# Patient Record
Sex: Male | Born: 1970 | Race: Black or African American | Hispanic: No | Smoking: Never smoker
Health system: Southern US, Community
[De-identification: ages and names within clinical notes are randomized; demographics above are authoritative.]

## PROBLEM LIST (undated history)

## (undated) DIAGNOSIS — R200 Anesthesia of skin: Secondary | ICD-10-CM

## (undated) DIAGNOSIS — K37 Unspecified appendicitis: Secondary | ICD-10-CM

## (undated) DIAGNOSIS — R208 Other disturbances of skin sensation: Secondary | ICD-10-CM

## (undated) DIAGNOSIS — K651 Peritoneal abscess: Secondary | ICD-10-CM

## (undated) HISTORY — PX: APPENDECTOMY: SHX54

---

## 2000-10-21 ENCOUNTER — Encounter: Payer: Self-pay | Admitting: Emergency Medicine

## 2000-10-21 ENCOUNTER — Emergency Department (HOSPITAL_COMMUNITY): Admission: EM | Admit: 2000-10-21 | Discharge: 2000-10-21 | Payer: Self-pay | Admitting: Emergency Medicine

## 2002-11-17 ENCOUNTER — Emergency Department (HOSPITAL_COMMUNITY): Admission: EM | Admit: 2002-11-17 | Discharge: 2002-11-17 | Payer: Self-pay | Admitting: *Deleted

## 2003-03-08 ENCOUNTER — Encounter: Payer: Self-pay | Admitting: Emergency Medicine

## 2003-03-08 ENCOUNTER — Emergency Department (HOSPITAL_COMMUNITY): Admission: EM | Admit: 2003-03-08 | Discharge: 2003-03-09 | Payer: Self-pay | Admitting: Emergency Medicine

## 2003-03-09 ENCOUNTER — Emergency Department (HOSPITAL_COMMUNITY): Admission: AC | Admit: 2003-03-09 | Discharge: 2003-03-09 | Payer: Self-pay

## 2003-03-09 ENCOUNTER — Emergency Department (HOSPITAL_COMMUNITY): Admission: EM | Admit: 2003-03-09 | Discharge: 2003-03-09 | Payer: Self-pay | Admitting: Emergency Medicine

## 2003-04-28 ENCOUNTER — Encounter: Payer: Self-pay | Admitting: Family Medicine

## 2003-04-28 ENCOUNTER — Emergency Department (HOSPITAL_COMMUNITY): Admission: AD | Admit: 2003-04-28 | Discharge: 2003-04-28 | Payer: Self-pay | Admitting: Family Medicine

## 2003-05-08 ENCOUNTER — Encounter: Payer: Self-pay | Admitting: Orthopaedic Surgery

## 2003-05-08 ENCOUNTER — Ambulatory Visit (HOSPITAL_COMMUNITY): Admission: RE | Admit: 2003-05-08 | Discharge: 2003-05-08 | Payer: Self-pay | Admitting: Orthopaedic Surgery

## 2003-08-24 ENCOUNTER — Emergency Department (HOSPITAL_COMMUNITY): Admission: EM | Admit: 2003-08-24 | Discharge: 2003-08-24 | Payer: Self-pay

## 2003-10-19 ENCOUNTER — Emergency Department (HOSPITAL_COMMUNITY): Admission: EM | Admit: 2003-10-19 | Discharge: 2003-10-19 | Payer: Self-pay

## 2004-04-06 ENCOUNTER — Emergency Department (HOSPITAL_COMMUNITY): Admission: EM | Admit: 2004-04-06 | Discharge: 2004-04-06 | Payer: Self-pay | Admitting: Emergency Medicine

## 2004-04-22 ENCOUNTER — Emergency Department (HOSPITAL_COMMUNITY): Admission: EM | Admit: 2004-04-22 | Discharge: 2004-04-22 | Payer: Self-pay | Admitting: Emergency Medicine

## 2012-08-04 ENCOUNTER — Emergency Department (HOSPITAL_COMMUNITY): Payer: Self-pay

## 2012-08-04 ENCOUNTER — Emergency Department (HOSPITAL_COMMUNITY)
Admission: EM | Admit: 2012-08-04 | Discharge: 2012-08-04 | Disposition: A | Discharge status: Home / Self Care | Payer: Self-pay | Attending: Emergency Medicine | Admitting: Emergency Medicine

## 2012-08-04 ENCOUNTER — Encounter (HOSPITAL_COMMUNITY): Payer: Self-pay

## 2012-08-04 DIAGNOSIS — Y929 Unspecified place or not applicable: Secondary | ICD-10-CM | POA: Insufficient documentation

## 2012-08-04 DIAGNOSIS — R0789 Other chest pain: Secondary | ICD-10-CM

## 2012-08-04 DIAGNOSIS — Y9389 Activity, other specified: Secondary | ICD-10-CM | POA: Insufficient documentation

## 2012-08-04 DIAGNOSIS — M549 Dorsalgia, unspecified: Secondary | ICD-10-CM | POA: Insufficient documentation

## 2012-08-04 DIAGNOSIS — X503XXA Overexertion from repetitive movements, initial encounter: Secondary | ICD-10-CM | POA: Insufficient documentation

## 2012-08-04 DIAGNOSIS — S298XXA Other specified injuries of thorax, initial encounter: Secondary | ICD-10-CM | POA: Insufficient documentation

## 2012-08-04 MED ORDER — IBUPROFEN 600 MG PO TABS: 600.0000 mg | ORAL_TABLET | Freq: Four times a day (QID) | ORAL | Status: AC | PRN

## 2012-08-04 MED ORDER — CYCLOBENZAPRINE HCL 5 MG PO TABS: 5.0000 mg | ORAL_TABLET | Freq: Three times a day (TID) | ORAL | Status: DC | PRN
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
## 2012-08-04 NOTE — ED Notes (Signed)
Pt presents with c/o lower rt rib pain that began last night. Pt denies SOB and increased pain with inhalation. Pt reports lifting weights last night. NAD noted
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
## 2012-08-06 NOTE — ED Provider Notes (Signed)
Medical screening examination/treatment/procedure(s) were performed by non-physician practitioner and as supervising physician I was immediately available for consultation/collaboration.  Donnetta Hutching, MD 08/06/12 2207

## 2015-10-09 ENCOUNTER — Encounter (HOSPITAL_COMMUNITY): Payer: Self-pay

## 2015-10-09 ENCOUNTER — Emergency Department (HOSPITAL_COMMUNITY)
Admission: EM | Admit: 2015-10-09 | Discharge: 2015-10-10 | Disposition: A | Discharge status: Home / Self Care | Payer: Self-pay | Attending: Emergency Medicine | Admitting: Emergency Medicine

## 2015-10-09 DIAGNOSIS — R1013 Epigastric pain: Secondary | ICD-10-CM | POA: Insufficient documentation

## 2015-10-09 DIAGNOSIS — R112 Nausea with vomiting, unspecified: Secondary | ICD-10-CM | POA: Insufficient documentation

## 2015-10-09 LAB — COMPREHENSIVE METABOLIC PANEL
ALBUMIN: 4.4 g/dL (ref 3.5–5.0)
ALK PHOS: 58 U/L (ref 38–126)
ALT: 15 U/L — ABNORMAL LOW (ref 17–63)
ANION GAP: 9 (ref 5–15)
AST: 24 U/L (ref 15–41)
BILIRUBIN TOTAL: 1.1 mg/dL (ref 0.3–1.2)
BUN: 10 mg/dL (ref 6–20)
CALCIUM: 8.9 mg/dL (ref 8.9–10.3)
CO2: 26 mmol/L (ref 22–32)
Chloride: 100 mmol/L — ABNORMAL LOW (ref 101–111)
Creatinine, Ser: 1.21 mg/dL (ref 0.61–1.24)
GFR calc Af Amer: >60 mL/min (ref >60)
GFR calc non Af Amer: >60 mL/min (ref >60)
GLUCOSE: 146 mg/dL — AB (ref 65–99)
Potassium: 5.1 mmol/L (ref 3.5–5.1)
SODIUM: 135 mmol/L (ref 135–145)
TOTAL PROTEIN: 7.5 g/dL (ref 6.5–8.1)

## 2015-10-09 LAB — CBC WITH DIFFERENTIAL/PLATELET
BASOS ABS: 0 10*3/uL (ref 0.0–0.1)
BASOS PCT: 0 %
EOS ABS: 0 10*3/uL (ref 0.0–0.7)
Eosinophils Relative: 0 %
HEMATOCRIT: 43.1 % (ref 39.0–52.0)
HEMOGLOBIN: 14.3 g/dL (ref 13.0–17.0)
Lymphocytes Relative: 12 %
Lymphs Abs: 0.9 10*3/uL (ref 0.7–4.0)
MCH: 31 pg (ref 26.0–34.0)
MCHC: 33.2 g/dL (ref 30.0–36.0)
MCV: 93.3 fL (ref 78.0–100.0)
MONOS PCT: 2 %
Monocytes Absolute: 0.2 10*3/uL (ref 0.1–1.0)
NEUTROS ABS: 6.2 10*3/uL (ref 1.7–7.7)
NEUTROS PCT: 86 %
Platelets: 239 10*3/uL (ref 150–400)
RBC: 4.62 MIL/uL (ref 4.22–5.81)
RDW: 12.8 % (ref 11.5–15.5)
WBC: 7.3 10*3/uL (ref 4.0–10.5)

## 2015-10-09 LAB — LIPASE, BLOOD: Lipase: 37 U/L (ref 11–51)

## 2015-10-09 MED ORDER — SODIUM CHLORIDE 0.9 % IV BOLUS (SEPSIS)
1000.0000 mL | Freq: Once | INTRAVENOUS | Status: AC
Start: 2015-10-09 — End: 2015-10-09
  Administered 2015-10-09: 1000 mL via INTRAVENOUS

## 2015-10-09 MED ORDER — ONDANSETRON HCL 4 MG/2ML IJ SOLN
4.0000 mg | Freq: Once | INTRAMUSCULAR | Status: AC
  Administered 2015-10-09: 4 mg via INTRAVENOUS
  Filled 2015-10-09: qty 2

## 2015-10-09 MED ORDER — ONDANSETRON 4 MG PO TBDP: ORAL_TABLET | ORAL | Status: DC

## 2015-10-09 MED ORDER — KETOROLAC TROMETHAMINE 30 MG/ML IJ SOLN
15.0000 mg | Freq: Once | INTRAMUSCULAR | Status: AC
  Administered 2015-10-09: 15 mg via INTRAVENOUS
  Filled 2015-10-09: qty 1

## 2015-10-09 NOTE — Discharge Instructions (Signed)
Return for fevers, persistent vomiting, right upper quadrant pain, right lower quadrant pain or other concerns. Take Zofran as needed for nausea.  If you were given medicines take as directed.  If you are on coumadin or contraceptives realize their levels and effectiveness is altered by many different medicines.  If you have any reaction (rash, tongues swelling, other) to the medicines stop taking and see a physician.    If your blood pressure was elevated in the ER make sure you follow up for management with a primary doctor or return for chest pain, shortness of breath or stroke symptoms.  Please follow up as directed and return to the ER or see a physician for new or worsening symptoms.  Thank you. Filed Vitals:   10/09/15 1930 10/09/15 1933 10/09/15 2211  BP: 86/44 99/40 137/83  Pulse: 60  75  Temp: 97.4 F (36.3 C)  98.3 F (36.8 C)  TempSrc: Oral  Oral  Resp: 18  18  Height: 6\' 3"  (1.905 m)    Weight: 257 lb (116.574 kg)    SpO2: 100%  97%

## 2015-10-09 NOTE — ED Provider Notes (Signed)
CSN: [id number]     Arrival date & time 10/09/15  1919 History   First MD Initiated Contact with Patient 10/09/15 1954     Chief Complaint  Patient presents with  . Abdominal Pain     (Consider location/radiation/quality/duration/timing/severity/associated sxs/prior Treatment) HPI Comments: 45 year old male with no significant medical history, nonsmoker presents with intermittent epigastric pain and vomiting since earlier today. No significant sick contacts no recent travel. No known gallbladder problems per no blood in the stools. No diarrhea. Patient gradually feeling better.  Patient is a 45 y.o. male presenting with abdominal pain. The history is provided by the patient.  Abdominal Pain Associated symptoms: nausea and vomiting   Associated symptoms: no chest pain, no chills, no dysuria, no fever and no shortness of breath     History reviewed. No pertinent past medical history. History reviewed. No pertinent past surgical history. No family history on file. Social History  Substance Use Topics  . Smoking status: Never Smoker   . Smokeless tobacco: None  . Alcohol Use: Yes     Comment: occ    Review of Systems  Constitutional: Negative for fever and chills.  HENT: Negative for congestion.   Eyes: Negative for visual disturbance.  Respiratory: Negative for shortness of breath.   Cardiovascular: Negative for chest pain.  Gastrointestinal: Positive for nausea, vomiting and abdominal pain. Negative for blood in stool.  Genitourinary: Negative for dysuria and flank pain.  Musculoskeletal: Negative for back pain, neck pain and neck stiffness.  Skin: Negative for rash.  Neurological: Negative for light-headedness and headaches.      Allergies  Review of patient's allergies indicates no known allergies.  Home Medications   Prior to Admission medications   Medication Sig Start Date End Date Taking? Authorizing Provider  OVER THE COUNTER MEDICATION Take 1 capsule by mouth  2 (two) times daily. Hydroxycut   Yes Historical Provider, MD  ondansetron (ZOFRAN ODT) 4 MG disintegrating tablet 4mg  ODT q4 hours prn nausea/vomit 10/09/15   Blane OharaJoshua Dijon Kohlman, MD   BP 137/83 mmHg  Pulse 75  Temp(Src) 98.3 F (36.8 C) (Oral)  Resp 18  Ht 6\' 3"  (1.905 m)  Wt 257 lb (116.574 kg)  BMI 32.12 kg/m2  SpO2 97% Physical Exam  Constitutional: He is oriented to person, place, and time. He appears well-developed and well-nourished.  HENT:  Head: Normocephalic and atraumatic.  Eyes: Conjunctivae are normal. Right eye exhibits no discharge. Left eye exhibits no discharge.  Neck: Normal range of motion. Neck supple. No tracheal deviation present.  Cardiovascular: Normal rate and regular rhythm.   Pulmonary/Chest: Effort normal and breath sounds normal.  Abdominal: Soft. He exhibits no distension. There is tenderness (mild epigastric). There is no guarding.  Musculoskeletal: He exhibits no edema.  Neurological: He is alert and oriented to person, place, and time.  Skin: Skin is warm. No rash noted.  Psychiatric: He has a normal mood and affect.  Nursing note and vitals reviewed.   ED Course  Procedures (including critical care time) Labs Review Labs Reviewed  COMPREHENSIVE METABOLIC PANEL - Abnormal; Notable for the following:    Chloride 100 (*)    Glucose, Bld 146 (*)    ALT 15 (*)    All other components within normal limits  CBC WITH DIFFERENTIAL/PLATELET  LIPASE, BLOOD    Imaging Review No results found. I have personally reviewed and evaluated these images and lab results as part of my medical decision-making.   EKG Interpretation None  MDM   Final diagnoses:  Epigastric pain  Non-intractable vomiting with nausea, vomiting of unspecified type   Well-appearing and I'll presents with mild abdominal pain intermittent vomiting. Discussed likely viral/toxin mediated however if he gets right upper quadrant pain fevers or does not improve in 24-48 hours to  see a physician. Patient improved on reassessment.  Results and differential diagnosis were discussed with the patient/parent/guardian. Xrays were independently reviewed by myself.  Close follow up outpatient was discussed, comfortable with the plan.   Medications  sodium chloride 0.9 % bolus 1,000 mL (0 mLs Intravenous Stopped 10/09/15 2120)  ketorolac (TORADOL) 30 MG/ML injection 15 mg (15 mg Intravenous Given 10/09/15 2030)  ondansetron (ZOFRAN) injection 4 mg (4 mg Intravenous Given 10/09/15 2027)    Filed Vitals:   10/09/15 1930 10/09/15 1933 10/09/15 2211  BP: 86/44 99/40 137/83  Pulse: 60  75  Temp: 97.4 F (36.3 C)  98.3 F (36.8 C)  TempSrc: Oral  Oral  Resp: 18  18  Height:  (1.905 m)    Weight: 257 lb (116.574 kg)    SpO2: 100%  97%    Final diagnoses:  Epigastric pain  Non-intractable vomiting with nausea, vomiting of unspecified type        Blane Ohara, MD 10/09/15 2306

## 2015-10-09 NOTE — ED Notes (Signed)
Abdominal pain all day. I have vomited a couple of times this morning but have not vomited any after noon today. Denies diarrhea.

## 2015-10-10 ENCOUNTER — Inpatient Hospital Stay (HOSPITAL_COMMUNITY)
Admission: EM | Admit: 2015-10-10 | Discharge: 2015-10-19 | DRG: 372 | Disposition: A | Discharge status: Home Health | Payer: Self-pay | Attending: General Surgery | Admitting: General Surgery

## 2015-10-10 ENCOUNTER — Emergency Department (HOSPITAL_COMMUNITY): Payer: Self-pay

## 2015-10-10 ENCOUNTER — Encounter (HOSPITAL_COMMUNITY): Payer: Self-pay | Admitting: Emergency Medicine

## 2015-10-10 DIAGNOSIS — E860 Dehydration: Secondary | ICD-10-CM | POA: Diagnosis present

## 2015-10-10 DIAGNOSIS — E872 Acidosis: Secondary | ICD-10-CM | POA: Diagnosis present

## 2015-10-10 DIAGNOSIS — K353 Acute appendicitis with localized peritonitis: Principal | ICD-10-CM | POA: Diagnosis present

## 2015-10-10 DIAGNOSIS — K3532 Acute appendicitis with perforation and localized peritonitis, without abscess: Secondary | ICD-10-CM | POA: Diagnosis present

## 2015-10-10 DIAGNOSIS — I10 Essential (primary) hypertension: Secondary | ICD-10-CM | POA: Diagnosis present

## 2015-10-10 DIAGNOSIS — K567 Ileus, unspecified: Secondary | ICD-10-CM | POA: Diagnosis present

## 2015-10-10 LAB — COMPREHENSIVE METABOLIC PANEL
ALK PHOS: 50 U/L (ref 38–126)
ALT: 12 U/L — AB (ref 17–63)
AST: 17 U/L (ref 15–41)
Albumin: 4 g/dL (ref 3.5–5.0)
Anion gap: 11 (ref 5–15)
BUN: 15 mg/dL (ref 6–20)
CALCIUM: 8.6 mg/dL — AB (ref 8.9–10.3)
CO2: 28 mmol/L (ref 22–32)
CREATININE: 1.51 mg/dL — AB (ref 0.61–1.24)
Chloride: 99 mmol/L — ABNORMAL LOW (ref 101–111)
GFR, EST NON AFRICAN AMERICAN: 54 mL/min — AB (ref >60)
Glucose, Bld: 201 mg/dL — ABNORMAL HIGH (ref 65–99)
Potassium: 4 mmol/L (ref 3.5–5.1)
Sodium: 138 mmol/L (ref 135–145)
Total Bilirubin: 1.1 mg/dL (ref 0.3–1.2)
Total Protein: 6.9 g/dL (ref 6.5–8.1)

## 2015-10-10 LAB — CBC WITH DIFFERENTIAL/PLATELET
BASOS PCT: 0 %
Basophils Absolute: 0 10*3/uL (ref 0.0–0.1)
EOS ABS: 0 10*3/uL (ref 0.0–0.7)
Eosinophils Relative: 0 %
HCT: 42.3 % (ref 39.0–52.0)
HEMOGLOBIN: 14.4 g/dL (ref 13.0–17.0)
Lymphocytes Relative: 6 %
Lymphs Abs: 0.6 10*3/uL — ABNORMAL LOW (ref 0.7–4.0)
MCH: 31.6 pg (ref 26.0–34.0)
MCHC: 34 g/dL (ref 30.0–36.0)
MCV: 93 fL (ref 78.0–100.0)
MONOS PCT: 6 %
Monocytes Absolute: 0.6 10*3/uL (ref 0.1–1.0)
NEUTROS PCT: 88 %
Neutro Abs: 8.6 10*3/uL — ABNORMAL HIGH (ref 1.7–7.7)
PLATELETS: 261 10*3/uL (ref 150–400)
RBC: 4.55 MIL/uL (ref 4.22–5.81)
RDW: 12.9 % (ref 11.5–15.5)
WBC: 9.7 10*3/uL (ref 4.0–10.5)

## 2015-10-10 LAB — URINALYSIS, ROUTINE W REFLEX MICROSCOPIC
Glucose, UA: NEGATIVE mg/dL
HGB URINE DIPSTICK: NEGATIVE
KETONES UR: 15 mg/dL — AB
Leukocytes, UA: NEGATIVE
Nitrite: NEGATIVE
PROTEIN: 30 mg/dL — AB
Specific Gravity, Urine: 1.01 (ref 1.005–1.030)
pH: 6 (ref 5.0–8.0)

## 2015-10-10 LAB — URINE MICROSCOPIC-ADD ON
Bacteria, UA: NONE SEEN
SQUAMOUS EPITHELIAL / LPF: NONE SEEN
WBC, UA: NONE SEEN WBC/hpf (ref 0–5)

## 2015-10-10 LAB — I-STAT CG4 LACTIC ACID, ED: Lactic Acid, Venous: 2.28 mmol/L (ref 0.5–2.0)

## 2015-10-10 LAB — LIPASE, BLOOD: LIPASE: 25 U/L (ref 11–51)

## 2015-10-10 MED ORDER — OXYCODONE-ACETAMINOPHEN 5-325 MG PO TABS
1.0000 | ORAL_TABLET | ORAL | Status: DC | PRN
  Administered 2015-10-11 – 2015-10-16 (×8): 2 via ORAL
  Filled 2015-10-10 (×9): qty 2

## 2015-10-10 MED ORDER — PIPERACILLIN-TAZOBACTAM 3.375 G IVPB
3.3750 g | Freq: Three times a day (TID) | INTRAVENOUS | Status: DC
  Administered 2015-10-11 – 2015-10-17 (×19): 3.375 g via INTRAVENOUS
  Filled 2015-10-10 (×22): qty 50

## 2015-10-10 MED ORDER — SODIUM CHLORIDE 0.9 % IV BOLUS (SEPSIS)
1000.0000 mL | Freq: Once | INTRAVENOUS | Status: AC
  Administered 2015-10-10: 1000 mL via INTRAVENOUS

## 2015-10-10 MED ORDER — HYDROMORPHONE HCL 1 MG/ML IJ SOLN
1.0000 mg | INTRAMUSCULAR | Status: DC | PRN
  Administered 2015-10-10 – 2015-10-19 (×45): 1 mg via INTRAVENOUS
  Filled 2015-10-10 (×47): qty 1

## 2015-10-10 MED ORDER — ONDANSETRON HCL 4 MG/2ML IJ SOLN
4.0000 mg | Freq: Once | INTRAMUSCULAR | Status: AC
  Administered 2015-10-10: 4 mg via INTRAVENOUS
  Filled 2015-10-10: qty 2

## 2015-10-10 MED ORDER — IOHEXOL 300 MG/ML  SOLN
50.0000 mL | Freq: Once | INTRAMUSCULAR | Status: AC | PRN
  Administered 2015-10-10: 50 mL via ORAL

## 2015-10-10 MED ORDER — DIPHENHYDRAMINE HCL 50 MG/ML IJ SOLN: 25.0000 mg | Freq: Four times a day (QID) | INTRAMUSCULAR | Status: DC | PRN

## 2015-10-10 MED ORDER — HYDROMORPHONE HCL 1 MG/ML IJ SOLN
1.0000 mg | Freq: Once | INTRAMUSCULAR | Status: AC
  Administered 2015-10-10: 1 mg via INTRAVENOUS
  Filled 2015-10-10: qty 1

## 2015-10-10 MED ORDER — ACETAMINOPHEN 325 MG PO TABS
650.0000 mg | ORAL_TABLET | Freq: Four times a day (QID) | ORAL | Status: DC | PRN
  Administered 2015-10-15: 650 mg via ORAL
  Administered 2015-10-15: 325 mg via ORAL
  Administered 2015-10-15 – 2015-10-16 (×2): 650 mg via ORAL
  Filled 2015-10-10 (×5): qty 2

## 2015-10-10 MED ORDER — ACETAMINOPHEN 650 MG RE SUPP
650.0000 mg | Freq: Four times a day (QID) | RECTAL | Status: DC | PRN
  Administered 2015-10-14: 650 mg via RECTAL

## 2015-10-10 MED ORDER — ONDANSETRON 4 MG PO TBDP: 4.0000 mg | ORAL_TABLET | Freq: Four times a day (QID) | ORAL | Status: DC | PRN

## 2015-10-10 MED ORDER — ONDANSETRON HCL 4 MG/2ML IJ SOLN: 4.0000 mg | Freq: Four times a day (QID) | INTRAMUSCULAR | Status: DC | PRN

## 2015-10-10 MED ORDER — SENNOSIDES-DOCUSATE SODIUM 8.6-50 MG PO TABS: 1.0000 | ORAL_TABLET | Freq: Every evening | ORAL | Status: DC | PRN

## 2015-10-10 MED ORDER — SIMETHICONE 80 MG PO CHEW
40.0000 mg | CHEWABLE_TABLET | Freq: Four times a day (QID) | ORAL | Status: DC | PRN
  Administered 2015-10-11: 40 mg via ORAL
  Filled 2015-10-10: qty 1

## 2015-10-10 MED ORDER — LACTATED RINGERS IV SOLN
INTRAVENOUS | Status: DC
  Administered 2015-10-10: 1000 mL via INTRAVENOUS
  Administered 2015-10-11 – 2015-10-12 (×2): via INTRAVENOUS
  Administered 2015-10-18: 10 mL/h via INTRAVENOUS

## 2015-10-10 MED ORDER — IOHEXOL 300 MG/ML  SOLN
100.0000 mL | Freq: Once | INTRAMUSCULAR | Status: AC | PRN
  Administered 2015-10-10: 100 mL via INTRAVENOUS

## 2015-10-10 MED ORDER — DIPHENHYDRAMINE HCL 25 MG PO CAPS: 25.0000 mg | ORAL_CAPSULE | Freq: Four times a day (QID) | ORAL | Status: DC | PRN

## 2015-10-10 MED ORDER — PIPERACILLIN-TAZOBACTAM 3.375 G IVPB 30 MIN
3.3750 g | Freq: Once | INTRAVENOUS | Status: AC
  Administered 2015-10-10: 3.375 g via INTRAVENOUS
  Filled 2015-10-10: qty 50

## 2015-10-10 MED ORDER — ENOXAPARIN SODIUM 40 MG/0.4ML ~~LOC~~ SOLN
40.0000 mg | SUBCUTANEOUS | Status: DC
  Administered 2015-10-10 – 2015-10-12 (×3): 40 mg via SUBCUTANEOUS
  Filled 2015-10-10 (×3): qty 0.4

## 2015-10-10 NOTE — ED Notes (Signed)
PT c/o continued nausea today with upper abdominal pain and took Zofran at 1100 today. PT states was seen in ED last night and results were negative and was d/c to home.

## 2015-10-10 NOTE — ED Provider Notes (Signed)
CSN: [id number]     Arrival date & time 10/10/15  1350 History   First MD Initiated Contact with Patient 10/10/15 1418     Chief Complaint  Patient presents with  . Abdominal Pain     (Consider location/radiation/quality/duration/timing/severity/associated sxs/prior Treatment) HPI Comments: The patient is a 45 year old male, he does have a prior history of alcohol abuse, he denies smoking, he reports that last night he developed abdominal pain, it was epigastric in location, he had been vomiting throughout the day, he never has had any abdominal surgery and denied any blood in his stools or diarrhea. The patient was seen in the emergency department, he had labs which were fairly unremarkable, his pain improved back to baseline and he was discharged home. He states that through the course of the last 12 hours she has had some ongoing pain and discomfort, and seems to have gotten worse, it is diffuse, he cannot pinpoint the location and there has been associated nausea though he has only vomited once in the last 12 hours. He denies any diarrhea and states that in fact he cannot even pass gas.  Patient is a 45 y.o. male presenting with abdominal pain. The history is provided by the patient and medical records.  Abdominal Pain   History reviewed. No pertinent past medical history. History reviewed. No pertinent past surgical history. History reviewed. No pertinent family history. Social History  Substance Use Topics  . Smoking status: Never Smoker   . Smokeless tobacco: None  . Alcohol Use: Yes     Comment: occ    Review of Systems  Gastrointestinal: Positive for abdominal pain.  All other systems reviewed and are negative.     Allergies  Review of patient's allergies indicates no known allergies.  Home Medications   Prior to Admission medications   Medication Sig Start Date End Date Taking? Authorizing Provider  acetaminophen (TYLENOL) 500 MG tablet Take 1,000 mg by mouth every  6 (six) hours as needed for moderate pain.   Yes Historical Provider, MD  ondansetron (ZOFRAN ODT) 4 MG disintegrating tablet 4mg  ODT q4 hours prn nausea/vomit Patient taking differently: Take 4 mg by mouth every 4 (four) hours as needed for nausea or vomiting. 4mg  ODT q4 hours prn nausea/vomit 10/09/15  Yes Blane OharaJoshua Zavitz, MD  OVER THE COUNTER MEDICATION Take 2 capsules by mouth daily. Hydroxycut   Yes Historical Provider, MD   BP 110/78 mmHg  Pulse 62  Temp(Src) 97.5 F (36.4 C) (Oral)  Resp 17  Ht 6' 2.5" (1.892 m)  Wt 258 lb (117.028 kg)  BMI 32.69 kg/m2  SpO2 98% Physical Exam  Constitutional: He appears well-developed and well-nourished. No distress.  HENT:  Head: Normocephalic and atraumatic.  Mouth/Throat: Oropharynx is clear and moist. No oropharyngeal exudate.  Eyes: Conjunctivae and EOM are normal. Pupils are equal, round, and reactive to light. Right eye exhibits no discharge. Left eye exhibits no discharge. No scleral icterus.  Neck: Normal range of motion. Neck supple. No JVD present. No thyromegaly present.  Cardiovascular: Normal rate, regular rhythm, normal heart sounds and intact distal pulses.  Exam reveals no gallop and no friction rub.   No murmur heard. Pulmonary/Chest: Effort normal and breath sounds normal. No respiratory distress. He has no wheezes. He has no rales.  Abdominal: Soft. Bowel sounds are normal. He exhibits no distension and no mass. There is tenderness. There is no rebound and no guarding.  Diffuse abdominal tenderness to palpation, no guarding or rebound, there is no  tympanitic sounds to percussion  Musculoskeletal: Normal range of motion. He exhibits no edema or tenderness.  Lymphadenopathy:    He has no cervical adenopathy.  Neurological: He is alert. Coordination normal.  Skin: Skin is warm and dry. No rash noted. No erythema.  Psychiatric: He has a normal mood and affect. His behavior is normal.  Nursing note and vitals reviewed.   ED  Course  Procedures (including critical care time) Labs Review Labs Reviewed  COMPREHENSIVE METABOLIC PANEL - Abnormal; Notable for the following:    Chloride 99 (*)    Glucose, Bld 201 (*)    Creatinine, Ser 1.51 (*)    Calcium 8.6 (*)    ALT 12 (*)    GFR calc non Af Amer 54 (*)    All other components within normal limits  CBC WITH DIFFERENTIAL/PLATELET - Abnormal; Notable for the following:    Neutro Abs 8.6 (*)    Lymphs Abs 0.6 (*)    All other components within normal limits  I-STAT CG4 LACTIC ACID, ED - Abnormal; Notable for the following:    Lactic Acid, Venous 2.28 (*)    All other components within normal limits  LIPASE, BLOOD    Imaging Review Dg Chest Port 1 View  10/10/2015  CLINICAL DATA:  Nausea with upper abdominal region pain EXAM: PORTABLE CHEST 1 VIEW COMPARISON:  June 04, 2013 FINDINGS: Degree of inspiration shallow. There is mild vessel crowding in the right base. No edema or consolidation. Heart is upper normal in size with pulmonary vascularity within normal limits. No adenopathy. There is an old healed fracture of the anterior right second rib. IMPRESSION: No edema or consolidation.  Shallow degree of inspiration. Electronically Signed   By: Bretta Bang III M.D.   On: 10/10/2015 15:28   I have personally reviewed and evaluated these images and lab results as part of my medical decision-making.    MDM   Final diagnoses:  None    The patient appears uncomfortable, he has diffuse tenderness, he is not tachycardic but his initial blood pressure was hypotensive. He will be given IV fluids, labs, will obtain CT scan to evaluate for the source of the patient's pain as well as an upright chest x-ray to look for free air though I think this is less likely as he is not peritoneal.  Change of shift - care signed out to Dr. Estell Harpin to recheck after CT completed and determine disposition.  Meds given in ED:  Medications  sodium chloride 0.9 % bolus 1,000  mL (1,000 mLs Intravenous New Bag/Given 10/10/15 1443)  sodium chloride 0.9 % bolus 1,000 mL (1,000 mLs Intravenous New Bag/Given 10/10/15 1443)  ondansetron (ZOFRAN) injection 4 mg (4 mg Intravenous Given 10/10/15 1443)  HYDROmorphone (DILAUDID) injection 1 mg (1 mg Intravenous Given 10/10/15 1443)  iohexol (OMNIPAQUE) 300 MG/ML solution 50 mL (50 mLs Oral Contrast Given 10/10/15 1619)  iohexol (OMNIPAQUE) 300 MG/ML solution 100 mL (100 mLs Intravenous Contrast Given 10/10/15 1620)    New Prescriptions   No medications on file      Eber Hong, MD 10/10/15 1630
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
## 2015-10-10 NOTE — ED Notes (Signed)
Dr. Jenkins at bedside. 

## 2015-10-11 LAB — GLUCOSE, CAPILLARY
GLUCOSE-CAPILLARY: 107 mg/dL — AB (ref 65–99)
GLUCOSE-CAPILLARY: 141 mg/dL — AB (ref 65–99)
Glucose-Capillary: 127 mg/dL — ABNORMAL HIGH (ref 65–99)
Glucose-Capillary: 109 mg/dL — ABNORMAL HIGH (ref 65–99)

## 2015-10-11 LAB — PHOSPHORUS: Phosphorus: 3.1 mg/dL (ref 2.5–4.6)

## 2015-10-11 LAB — LACTIC ACID, PLASMA: LACTIC ACID, VENOUS: 1.1 mmol/L (ref 0.5–2.0)

## 2015-10-11 LAB — CBC
HEMATOCRIT: 40.1 % (ref 39.0–52.0)
HEMOGLOBIN: 13.2 g/dL (ref 13.0–17.0)
MCH: 31.1 pg (ref 26.0–34.0)
MCHC: 32.9 g/dL (ref 30.0–36.0)
MCV: 94.4 fL (ref 78.0–100.0)
Platelets: 265 10*3/uL (ref 150–400)
RBC: 4.25 MIL/uL (ref 4.22–5.81)
RDW: 13.2 % (ref 11.5–15.5)
WBC: 14.5 10*3/uL — AB (ref 4.0–10.5)

## 2015-10-11 LAB — BASIC METABOLIC PANEL
Anion gap: 8 (ref 5–15)
BUN: 17 mg/dL (ref 6–20)
CHLORIDE: 99 mmol/L — AB (ref 101–111)
CO2: 29 mmol/L (ref 22–32)
Calcium: 8.1 mg/dL — ABNORMAL LOW (ref 8.9–10.3)
Creatinine, Ser: 1.44 mg/dL — ABNORMAL HIGH (ref 0.61–1.24)
GFR calc Af Amer: >60 mL/min (ref >60)
GFR calc non Af Amer: 57 mL/min — ABNORMAL LOW (ref >60)
Glucose, Bld: 161 mg/dL — ABNORMAL HIGH (ref 65–99)
POTASSIUM: 3.8 mmol/L (ref 3.5–5.1)
SODIUM: 136 mmol/L (ref 135–145)

## 2015-10-11 LAB — MAGNESIUM: Magnesium: 1.7 mg/dL (ref 1.7–2.4)

## 2015-10-11 MED ORDER — INSULIN ASPART 100 UNIT/ML ~~LOC~~ SOLN: 0.0000 [IU] | Freq: Every day | SUBCUTANEOUS | Status: DC

## 2015-10-11 MED ORDER — INSULIN ASPART 100 UNIT/ML ~~LOC~~ SOLN
0.0000 [IU] | Freq: Three times a day (TID) | SUBCUTANEOUS | Status: DC
  Administered 2015-10-11 – 2015-10-17 (×10): 2 [IU] via SUBCUTANEOUS
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
## 2015-10-12 LAB — BASIC METABOLIC PANEL
Anion gap: 8 (ref 5–15)
BUN: 12 mg/dL (ref 6–20)
CALCIUM: 8.5 mg/dL — AB (ref 8.9–10.3)
CO2: 32 mmol/L (ref 22–32)
CREATININE: 1.11 mg/dL (ref 0.61–1.24)
Chloride: 96 mmol/L — ABNORMAL LOW (ref 101–111)
GFR calc Af Amer: >60 mL/min (ref >60)
GLUCOSE: 150 mg/dL — AB (ref 65–99)
Potassium: 3.7 mmol/L (ref 3.5–5.1)
Sodium: 136 mmol/L (ref 135–145)

## 2015-10-12 LAB — GLUCOSE, CAPILLARY
Glucose-Capillary: 131 mg/dL — ABNORMAL HIGH (ref 65–99)
Glucose-Capillary: 124 mg/dL — ABNORMAL HIGH (ref 65–99)
Glucose-Capillary: 144 mg/dL — ABNORMAL HIGH (ref 65–99)
Glucose-Capillary: 143 mg/dL — ABNORMAL HIGH (ref 65–99)

## 2015-10-12 LAB — CBC
HCT: 39.3 % (ref 39.0–52.0)
Hemoglobin: 13.1 g/dL (ref 13.0–17.0)
MCH: 31.4 pg (ref 26.0–34.0)
MCHC: 33.3 g/dL (ref 30.0–36.0)
MCV: 94.2 fL (ref 78.0–100.0)
PLATELETS: 261 10*3/uL (ref 150–400)
RBC: 4.17 MIL/uL — ABNORMAL LOW (ref 4.22–5.81)
RDW: 13.1 % (ref 11.5–15.5)
WBC: 14.9 10*3/uL — ABNORMAL HIGH (ref 4.0–10.5)

## 2015-10-12 LAB — HEMOGLOBIN A1C
Hgb A1c MFr Bld: 5.6 % (ref 4.8–5.6)
MEAN PLASMA GLUCOSE: 114 mg/dL

## 2015-10-12 MED ORDER — MAGNESIUM HYDROXIDE 400 MG/5ML PO SUSP
30.0000 mL | Freq: Three times a day (TID) | ORAL | Status: DC
  Administered 2015-10-12 – 2015-10-15 (×10): 30 mL via ORAL
  Filled 2015-10-12 (×11): qty 30

## 2015-10-12 NOTE — Progress Notes (Signed)
Nutrition Brief Note  Patient identified on the Malnutrition Screening Tool (MST) Report.   Pt is being followed for perforated appendix. He is tolerating oral intake liquids and is taking 100%. His weight is essentially unchanged in the past 3 years. BMI places him at obese class I.  Wt Readings from Last 15 Encounters:  10/10/15 257 lb 9.6 oz (116.847 kg)  10/09/15 257 lb (116.574 kg)  08/04/12 260 lb (117.935 kg)   Labs and medications reviewed.   No nutrition interventions warranted at this time. If nutrition issues arise, please consult RD.    Royann ShiversLynn Morgen Linebaugh MS,RD,CSG,LDN Office: [phone] Pager: [phone]
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
## 2015-10-12 NOTE — Clinical Documentation Improvement (Signed)
  General Surgery  Can the diagnosis of renal insufficiency be further specified? Please provide a diagnosis associated with the below clinical indicators and treatment provided and document findings in next progress note. Thank you!   Acute Renal Failure/Acute Kidney Injury  Acute on Chronic Renal Failure - with stage please if known  Chronic Renal Failure - with stage please if known  Renal Insufficiency as documented  Other  Clinically Undetermined  Document any associated diagnoses/conditions.  Supporting Information:  Creatinine was 1.51 two days ago; went to 1.44 yesterday and was 1.11 this morning  IV hydration provided with Lactated Ringers at 125 cc/hr  This is a 0.40 drop in over 48 hours  Please exercise your independent, professional judgment when responding. A specific answer is not anticipated or expected.  Thank You,  Shellee MiloEileen T Revia Nghiem RN, BSN, CCDS Health Information Management [place] [phone]; Cell: [phone]

## 2015-10-13 ENCOUNTER — Inpatient Hospital Stay (HOSPITAL_COMMUNITY): Payer: Self-pay

## 2015-10-13 LAB — GLUCOSE, CAPILLARY
GLUCOSE-CAPILLARY: 133 mg/dL — AB (ref 65–99)
GLUCOSE-CAPILLARY: 112 mg/dL — AB (ref 65–99)
GLUCOSE-CAPILLARY: 140 mg/dL — AB (ref 65–99)
GLUCOSE-CAPILLARY: 127 mg/dL — AB (ref 65–99)

## 2015-10-13 LAB — CBC
HCT: 38.5 % — ABNORMAL LOW (ref 39.0–52.0)
HEMOGLOBIN: 12.9 g/dL — AB (ref 13.0–17.0)
MCH: 31.4 pg (ref 26.0–34.0)
MCHC: 33.5 g/dL (ref 30.0–36.0)
MCV: 93.7 fL (ref 78.0–100.0)
Platelets: 258 10*3/uL (ref 150–400)
RBC: 4.11 MIL/uL — AB (ref 4.22–5.81)
RDW: 13.1 % (ref 11.5–15.5)
WBC: 12 10*3/uL — ABNORMAL HIGH (ref 4.0–10.5)

## 2015-10-13 MED ORDER — IOHEXOL 300 MG/ML  SOLN
100.0000 mL | Freq: Once | INTRAMUSCULAR | Status: AC | PRN
  Administered 2015-10-13: 100 mL via INTRAVENOUS

## 2015-10-13 MED ORDER — ALBUTEROL SULFATE (2.5 MG/3ML) 0.083% IN NEBU
2.5000 mg | INHALATION_SOLUTION | Freq: Four times a day (QID) | RESPIRATORY_TRACT | Status: DC
  Administered 2015-10-13 – 2015-10-15 (×8): 2.5 mg via RESPIRATORY_TRACT
  Filled 2015-10-13 (×9): qty 3

## 2015-10-13 MED ORDER — ENOXAPARIN SODIUM 60 MG/0.6ML ~~LOC~~ SOLN
60.0000 mg | SUBCUTANEOUS | Status: DC
  Administered 2015-10-14 – 2015-10-18 (×5): 60 mg via SUBCUTANEOUS
  Filled 2015-10-13 (×5): qty 0.6

## 2015-10-13 NOTE — Progress Notes (Addendum)
  Subjective: Patient still with abdominal distention, though he did have a small bowel movement this morning. States his abdominal pain has decreased.  Objective: Vital signs in last 24 hours: Temp:  [98.6 F (37 C)-99.1 F (37.3 C)] 99.1 F (37.3 C) (03/21 0503) Pulse Rate:  [95-113] 113 (03/21 0503) Resp:  [18-20] 20 (03/21 0503) BP: (149-171)/(81-112) 171/112 mmHg (03/21 0503) SpO2:  [93 %-95 %] 93 % (03/21 0503) Last BM Date: 10/11/15  Intake/Output from previous day: 03/20 0701 - 03/21 0700 In: 5127.5 [P.O.:720; I.V.:4157.5; IV Piggyback:250] Out: 1100 [Urine:1100] Intake/Output this shift:    General appearance: alert, cooperative and no distress Resp: Some basilar expiratory wheezing noted. Cardio: regular rate and rhythm, S1, S2 normal, no murmur, click, rub or gallop GI: Soft but distended. No rigidity noted. No specific tenderness noted.  Lab Results:   Recent Labs  10/12/15 0615 10/13/15 0549  WBC 14.9* 12.0*  HGB 13.1 12.9*  HCT 39.3 38.5*  PLT 261 258   BMET  Recent Labs  10/11/15 0539 10/12/15 0615  NA 136 136  K 3.8 3.7  CL 99* 96*  CO2 29 32  GLUCOSE 161* 150*  BUN 17 12  CREATININE 1.44* 1.11  CALCIUM 8.1* 8.5*   PT/INR No results for input(s): LABPROT, INR in the last 72 hours.  Studies/Results: No results found.  Anti-infectives: Anti-infectives    Start     Dose/Rate Route Frequency Ordered Stop   10/11/15 0200  piperacillin-tazobactam (ZOSYN) IVPB 3.375 g     3.375 g 12.5 mL/hr over 240 Minutes Intravenous Every 8 hours 10/10/15 2048     10/10/15 1730  piperacillin-tazobactam (ZOSYN) IVPB 3.375 g     3.375 g 100 mL/hr over 30 Minutes Intravenous  Once 10/10/15 1726 10/10/15 1914      Assessment/Plan: Impression: Perforated acute appendicitis. Leukocytosis is slightly improved. Will get CT scan of abdomen for follow-up. Will start on albuterol nebs for his wheezing. Further management is pending those results. Renal  insufficiency has resolved. This was most likely secondary to mild dehydration.  LOS: 3 days    Kamani Lewter A 10/13/2015

## 2015-10-13 NOTE — Progress Notes (Signed)
Pt's CT scan results are in. MD paged about results. BP 163/88 and HR tachy at 100. Temp 99.5 Pt states some discomfort. Abdomen still distended and tender. RN will continue to monitor. Lesly Dukesachel J Everett, RN

## 2015-10-13 NOTE — Progress Notes (Signed)
Patient ID: Gabriel BariDarren [name], male   DOB: 10/09/1970, 45 y.o.   MRN: [id number]   Pt scheduled for abscess drain placement in IR [facility] 3/22  See orders

## 2015-10-13 NOTE — Progress Notes (Signed)
[facility]  Called IR and was told that the patient will sign his own informed consent there. Will be transport by [facility]. Lesly Dukesachel J Everett, RN

## 2015-10-14 ENCOUNTER — Ambulatory Visit (HOSPITAL_COMMUNITY): Payer: Self-pay

## 2015-10-14 DIAGNOSIS — K352 Acute appendicitis with generalized peritonitis: Secondary | ICD-10-CM | POA: Insufficient documentation

## 2015-10-14 HISTORY — PX: DRAINAGE ABD/PERITON ABS PERC (ARMC HX): HXRAD1700

## 2015-10-14 LAB — GLUCOSE, CAPILLARY
GLUCOSE-CAPILLARY: 121 mg/dL — AB (ref 65–99)
GLUCOSE-CAPILLARY: 86 mg/dL (ref 65–99)
Glucose-Capillary: 118 mg/dL — ABNORMAL HIGH (ref 65–99)
Glucose-Capillary: 113 mg/dL — ABNORMAL HIGH (ref 65–99)

## 2015-10-14 LAB — CBC
HCT: 37.4 % — ABNORMAL LOW (ref 39.0–52.0)
HEMOGLOBIN: 12.7 g/dL — AB (ref 13.0–17.0)
MCH: 31.8 pg (ref 26.0–34.0)
MCHC: 34 g/dL (ref 30.0–36.0)
MCV: 93.7 fL (ref 78.0–100.0)
PLATELETS: 288 10*3/uL (ref 150–400)
RBC: 3.99 MIL/uL — AB (ref 4.22–5.81)
RDW: 13.2 % (ref 11.5–15.5)
WBC: 9.1 10*3/uL (ref 4.0–10.5)

## 2015-10-14 LAB — PROTIME-INR
INR: 1.19 (ref 0.00–1.49)
Prothrombin Time: 15.3 seconds — ABNORMAL HIGH (ref 11.6–15.2)

## 2015-10-14 MED ORDER — MIDAZOLAM HCL 2 MG/2ML IJ SOLN
INTRAMUSCULAR | Status: AC | PRN
  Administered 2015-10-14 (×2): 1 mg via INTRAVENOUS

## 2015-10-14 MED ORDER — SODIUM CHLORIDE 0.9 % IV SOLN
INTRAVENOUS | Status: AC | PRN
  Administered 2015-10-14: 75 mL/h via INTRAVENOUS

## 2015-10-14 MED ORDER — FENTANYL CITRATE (PF) 100 MCG/2ML IJ SOLN
INTRAMUSCULAR | Status: AC | PRN
  Administered 2015-10-14 (×2): 50 ug via INTRAVENOUS

## 2015-10-14 MED ORDER — ENALAPRILAT 1.25 MG/ML IV SOLN
1.2500 mg | Freq: Four times a day (QID) | INTRAVENOUS | Status: DC
  Administered 2015-10-14: 1.25 mg via INTRAVENOUS
  Administered 2015-10-15: 16:00:00 via INTRAVENOUS
  Administered 2015-10-15 – 2015-10-18 (×11): 1.25 mg via INTRAVENOUS
  Filled 2015-10-14 (×13): qty 2

## 2015-10-14 MED ORDER — LISINOPRIL 5 MG PO TABS
2.5000 mg | ORAL_TABLET | Freq: Four times a day (QID) | ORAL | Status: DC
  Filled 2015-10-14: qty 1

## 2015-10-14 MED ORDER — HYDROCODONE-ACETAMINOPHEN 5-325 MG PO TABS: 1.0000 | ORAL_TABLET | ORAL | Status: DC | PRN
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
## 2015-10-14 NOTE — Sedation Documentation (Addendum)
Pt seen by Dr Deanne CofferHassell and Jeananne RamaKevin Allred PA.  Pt aware of events.  Questions answered. Consent signed.  Family at bedside.

## 2015-10-14 NOTE — Progress Notes (Signed)
[facility] for procedure round trip. VSS. Report called to [facility] and pt in no distress. Lesly Dukesachel J Everett, RN

## 2015-10-14 NOTE — Plan of Care (Signed)
Problem: Bowel/Gastric: Goal: Will not experience complications related to bowel motility Outcome: Progressing Pt having episodes of some diarrhea. Currently getting Milk of Mag. Passing gas with some relief of distention.

## 2015-10-14 NOTE — Sedation Documentation (Signed)
Dr Deanne CofferHassell reviewing images.

## 2015-10-14 NOTE — Progress Notes (Signed)
BP elevated at 185/104. Assymptomatic. Temp 100.4. MD paged and made aware. MD ordered to continue to monitor VS Q 30 mins and continue with scheduled Vasotec 1.25mg  and if BP continued to elevate, MD is to be paged. Next dose due at 0000. RN explained orders to oncoming night RN. RN verbalized understanding. RN stated she will give Tylenol PRN to pt for elevated temp. Lesly Dukesachel J Everett, RN

## 2015-10-14 NOTE — Progress Notes (Signed)
No IV lisinopril available. MD paged and D/C lisinopril and ordered  1.25mg  Enalapril (Vasotec) IV. RN recheck BP 158/96. Continuing to monitor and will pass on to oncoming RN. Lesly Dukesachel J Everett, RN

## 2015-10-14 NOTE — Sedation Documentation (Signed)
In CT

## 2015-10-14 NOTE — Progress Notes (Signed)
MD notified and paged regarding pt coming back to unit. MD ordered pt to have a full liquid. BP elevated at 162/97 and staying elevated. MD verbally ordered routine Lisinopril 2.5 mg IV Q6. RN will continue to monitor pt. Lesly Dukesachel J Everett, RN

## 2015-10-14 NOTE — Progress Notes (Addendum)
Pt experiencing expiratory wheezing and is charted on assessment.  RN educated pt to continue to use the incentive spirometer as BS. Pt made aware of benefit.

## 2015-10-14 NOTE — Procedures (Signed)
CT RLQ abscess drain catheter placement Sample of purulent aspirate sent for GS, C&S No complication No blood loss. See complete dictation in [facility].

## 2015-10-14 NOTE — Sedation Documentation (Signed)
o2 d/c'd 

## 2015-10-14 NOTE — Sedation Documentation (Signed)
Awaiting PA.

## 2015-10-14 NOTE — Sedation Documentation (Signed)
[facility] here for transport.  Tolerated procedure well.
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
## 2015-10-15 LAB — GLUCOSE, CAPILLARY
GLUCOSE-CAPILLARY: 115 mg/dL — AB (ref 65–99)
GLUCOSE-CAPILLARY: 149 mg/dL — AB (ref 65–99)
GLUCOSE-CAPILLARY: 138 mg/dL — AB (ref 65–99)
Glucose-Capillary: 135 mg/dL — ABNORMAL HIGH (ref 65–99)

## 2015-10-15 MED ORDER — POLYETHYLENE GLYCOL 3350 17 G PO PACK
17.0000 g | PACK | Freq: Two times a day (BID) | ORAL | Status: DC
  Administered 2015-10-15 (×2): 17 g via ORAL
  Filled 2015-10-15 (×2): qty 1

## 2015-10-15 MED ORDER — ALBUTEROL SULFATE (2.5 MG/3ML) 0.083% IN NEBU: 2.5000 mg | INHALATION_SOLUTION | Freq: Four times a day (QID) | RESPIRATORY_TRACT | Status: DC | PRN

## 2015-10-15 MED ORDER — METOPROLOL TARTRATE 1 MG/ML IV SOLN
2.5000 mg | Freq: Four times a day (QID) | INTRAVENOUS | Status: DC
  Administered 2015-10-16 – 2015-10-18 (×9): 2.5 mg via INTRAVENOUS
  Filled 2015-10-15 (×10): qty 5

## 2015-10-15 NOTE — Progress Notes (Signed)
Dressing change completed 10/15/2015  0630. No redness, no swelling.
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
## 2015-10-16 LAB — CBC
HCT: 36 % — ABNORMAL LOW (ref 39.0–52.0)
Hemoglobin: 11.9 g/dL — ABNORMAL LOW (ref 13.0–17.0)
MCH: 30.7 pg (ref 26.0–34.0)
MCHC: 33.1 g/dL (ref 30.0–36.0)
MCV: 92.8 fL (ref 78.0–100.0)
PLATELETS: 333 10*3/uL (ref 150–400)
RBC: 3.88 MIL/uL — AB (ref 4.22–5.81)
RDW: 13.6 % (ref 11.5–15.5)
WBC: 17.1 10*3/uL — AB (ref 4.0–10.5)

## 2015-10-16 LAB — GLUCOSE, CAPILLARY
GLUCOSE-CAPILLARY: 119 mg/dL — AB (ref 65–99)
GLUCOSE-CAPILLARY: 124 mg/dL — AB (ref 65–99)
Glucose-Capillary: 101 mg/dL — ABNORMAL HIGH (ref 65–99)
Glucose-Capillary: 111 mg/dL — ABNORMAL HIGH (ref 65–99)

## 2015-10-16 NOTE — Progress Notes (Signed)
Subjective: Past start moving his bowels. They are watery in nature. Abdominal pain has eased, but still feels distended. Appetite decreased.  Objective: Vital signs in last 24 hours: Temp:  [100 F (37.8 C)-101.4 F (38.6 C)] 100.5 F (38.1 C) (03/24 0048) Pulse Rate:  [102-108] 102 (03/24 0048) Resp:  [18] 18 (03/24 0048) BP: (132-161)/(79-97) 152/94 mmHg (03/24 0048) SpO2:  [94 %-95 %] 94 % (03/24 0048) Last BM Date: 10/15/15  Intake/Output from previous day: 03/23 0701 - 03/24 0700 In: -  Out: 10  Intake/Output this shift:    General appearance: alert and cooperative Resp: clear to auscultation bilaterally Cardio: regular rate and rhythm, S1, S2 normal, no murmur, click, rub or gallop GI: Soft, distended but not rigid. No significant change since yesterday.  Lab Results:   Recent Labs  10/14/15 0513 10/16/15 0439  WBC 9.1 17.1*  HGB 12.7* 11.9*  HCT 37.4* 36.0*  PLT 288 333   BMET No results for input(s): NA, K, CL, CO2, GLUCOSE, BUN, CREATININE, CALCIUM in the last 72 hours. PT/INR  Recent Labs  10/14/15 0513  LABPROT 15.3*  INR 1.19    Studies/Results: Ct Image Guided Drainage By Percutaneous Catheter  10/14/2015  CLINICAL DATA:  Acute perforated appendicitis with Periappendiceal abscess. EXAM: CT GUIDED DRAINAGE OF RIGHT LOWER QUADRANT PERITONEAL ABSCESS ANESTHESIA/SEDATION: Intravenous Fentanyl and Versed were administered as conscious sedation during continuous monitoring of the patient's level of consciousness and physiological / cardiorespiratory status by the radiology RN, with a total moderate sedation time of 18 minutes. PROCEDURE: The procedure, risks, benefits, and alternatives were explained to the patient. Questions regarding the procedure were encouraged and answered. The patient understands and consents to the procedure. Patient placed supine. Select axial scans through the abdomen obtained. Due to multiple loops of overlying bowel curb, no  safe percutaneous access to the right lower quadrant collection was identified. Patient placed in left lateral decubitus position and select axial scans through the abdomen obtained. There is little redistribution of the bowel loops around the inflamed right lower quadrant loculated peritoneal gas and fluid collection, and no anterior percutaneous access was available. A posterior approach exceeded the length of available access needles. Patient was placed prone and select axial scans through the abdomen obtained. The right lower quadrant collection was localized and a safe posterolateral approach was determined. Skin entry site was marked. The operative field was prepped with chlorhexidinein a sterile fashion, and a sterile drape was applied covering the operative field. A sterile gown and sterile gloves were used for the procedure. Local anesthesia was provided with 1% Lidocaine. Under CT fluoroscopic guidance, a 20 cm 18 gauge trocar needle was advanced into the collection. Purulent material spontaneously returned through the needle hub. An Amplatz guidewire advanced easily within the collection. Tract was dilated to facilitate placement of a 12 French pigtail catheter, formed centrally within the collection. A sample of the purulent aspirate was sent for routine Gram stain and culture. Catheter secured externally with 0 Prolene suture and placed to gravity drain bag. The patient tolerated the procedure well. COMPLICATIONS: None immediate FINDINGS: The right lower quadrant peritoneal loculated gas and fluid collection was localized. There is moderate surrounding inflammation. Multiple overlying loops of small large bowel prevented safe approach for anterior or anterolateral percutaneous drain placement. A posterolateral approach was used and drain catheter successfully placed. IMPRESSION: 1. Technically successful CT-guided periappendiceal abscess drain catheter placement. Electronically Signed   By: Corlis Leak  M.D.   On: 10/14/2015 15:25  Anti-infectives: Anti-infectives    Start     Dose/Rate Route Frequency Ordered Stop   10/11/15 0200  piperacillin-tazobactam (ZOSYN) IVPB 3.375 g     3.375 g 12.5 mL/hr over 240 Minutes Intravenous Every 8 hours 10/10/15 2048     10/10/15 1730  piperacillin-tazobactam (ZOSYN) IVPB 3.375 g     3.375 g 100 mL/hr over 30 Minutes Intravenous  Once 10/10/15 1726 10/10/15 1914      Assessment/Plan: Impression: Day 2, status post percutaneous drainage of appendiceal abscess. Patient still having some fevers and does have leukocytosis. Will check stool for C. difficile toxin. Discussed with patient and friend. If patient does not significant improve over the next few days, may require exploratory laparotomy. Will stop MiraLAX.  LOS: 6 days    Gabriel [name] A 10/16/2015

## 2015-10-16 NOTE — Progress Notes (Signed)
Notified MD that the patients temp was 102.9.  Recommended blood cultures.  New orders given and followed.

## 2015-10-17 LAB — BASIC METABOLIC PANEL
ANION GAP: 13 (ref 5–15)
BUN: 18 mg/dL (ref 6–20)
CALCIUM: 8.4 mg/dL — AB (ref 8.9–10.3)
CHLORIDE: 94 mmol/L — AB (ref 101–111)
CO2: 28 mmol/L (ref 22–32)
Creatinine, Ser: 1.19 mg/dL (ref 0.61–1.24)
GFR calc Af Amer: >60 mL/min (ref >60)
GFR calc non Af Amer: >60 mL/min (ref >60)
GLUCOSE: 127 mg/dL — AB (ref 65–99)
Potassium: 3.6 mmol/L (ref 3.5–5.1)
Sodium: 135 mmol/L (ref 135–145)

## 2015-10-17 LAB — CBC
HEMATOCRIT: 36.4 % — AB (ref 39.0–52.0)
HEMOGLOBIN: 12.6 g/dL — AB (ref 13.0–17.0)
MCH: 32.2 pg (ref 26.0–34.0)
MCHC: 34.6 g/dL (ref 30.0–36.0)
MCV: 93.1 fL (ref 78.0–100.0)
Platelets: 360 10*3/uL (ref 150–400)
RBC: 3.91 MIL/uL — ABNORMAL LOW (ref 4.22–5.81)
RDW: 13.8 % (ref 11.5–15.5)
WBC: 19 10*3/uL — AB (ref 4.0–10.5)

## 2015-10-17 LAB — GLUCOSE, CAPILLARY
GLUCOSE-CAPILLARY: 96 mg/dL (ref 65–99)
GLUCOSE-CAPILLARY: 112 mg/dL — AB (ref 65–99)
Glucose-Capillary: 124 mg/dL — ABNORMAL HIGH (ref 65–99)
Glucose-Capillary: 117 mg/dL — ABNORMAL HIGH (ref 65–99)

## 2015-10-17 MED ORDER — SODIUM CHLORIDE 0.9 % IV SOLN
1.0000 g | INTRAVENOUS | Status: DC
  Administered 2015-10-17 – 2015-10-18 (×2): 1 g via INTRAVENOUS
  Filled 2015-10-17 (×4): qty 1

## 2015-10-17 NOTE — Progress Notes (Signed)
  Subjective: Feels about the same as he did yesterday.  Objective: Vital signs in last 24 hours: Temp:  [97.6 F (36.4 C)-99.5 F (37.5 C)] 99.1 F (37.3 C) (03/25 0602) Pulse Rate:  [83-113] 112 (03/25 0602) Resp:  [18] 18 (03/25 0602) BP: (131-155)/(87-96) 142/87 mmHg (03/25 0602) SpO2:  [95 %-97 %] 95 % (03/25 0602) Last BM Date: 10/16/15  Intake/Output from previous day: 03/24 0701 - 03/25 0700 In: 490 [P.O.:480] Out: 65 [Drains:25] Intake/Output this shift:    General appearance: alert, cooperative and no distress Resp: clear to auscultation bilaterally Cardio: regular rate and rhythm, S1, S2 normal, no murmur, click, rub or gallop GI: Soft, distended, unchanged from yesterday.  Lab Results:   Recent Labs  10/16/15 0439 10/17/15 0700  WBC 17.1* 19.0*  HGB 11.9* 12.6*  HCT 36.0* 36.4*  PLT 333 360   BMET  Recent Labs  10/17/15 0700  NA 135  K 3.6  CL 94*  CO2 28  GLUCOSE 127*  BUN 18  CREATININE 1.19  CALCIUM 8.4*   PT/INR No results for input(s): LABPROT, INR in the last 72 hours.  Studies/Results: No results found.  Anti-infectives: Anti-infectives    Start     Dose/Rate Route Frequency Ordered Stop   10/17/15 1300  ertapenem (INVANZ) 1 g in sodium chloride 0.9 % 50 mL IVPB     1 g 100 mL/hr over 30 Minutes Intravenous Every 24 hours 10/17/15 1117     10/11/15 0200  piperacillin-tazobactam (ZOSYN) IVPB 3.375 g  Status:  Discontinued     3.375 g 12.5 mL/hr over 240 Minutes Intravenous Every 8 hours 10/10/15 2048 10/17/15 1117   10/10/15 1730  piperacillin-tazobactam (ZOSYN) IVPB 3.375 g     3.375 g 100 mL/hr over 30 Minutes Intravenous  Once 10/10/15 1726 10/10/15 1914      Assessment/Plan: Impression: Status post CT-guided drainage of intra-abdominal abscess from perforated appendicitis. Leukocytosis continues to worsen. Clinically, he appears about the same. In discussion with pharmacology, [facility]. If  patient doesn't improve significantly over the next 24-48 hours, he will require an exploratory laparotomy.  LOS: 7 days    Milad Bublitz A 10/17/2015

## 2015-10-18 LAB — GLUCOSE, CAPILLARY
GLUCOSE-CAPILLARY: 99 mg/dL (ref 65–99)
GLUCOSE-CAPILLARY: 102 mg/dL — AB (ref 65–99)
Glucose-Capillary: 109 mg/dL — ABNORMAL HIGH (ref 65–99)
Glucose-Capillary: 101 mg/dL — ABNORMAL HIGH (ref 65–99)

## 2015-10-18 LAB — CBC
HCT: 35.7 % — ABNORMAL LOW (ref 39.0–52.0)
Hemoglobin: 11.9 g/dL — ABNORMAL LOW (ref 13.0–17.0)
MCH: 31.2 pg (ref 26.0–34.0)
MCHC: 33.3 g/dL (ref 30.0–36.0)
MCV: 93.7 fL (ref 78.0–100.0)
PLATELETS: 433 10*3/uL — AB (ref 150–400)
RBC: 3.81 MIL/uL — AB (ref 4.22–5.81)
RDW: 13.6 % (ref 11.5–15.5)
WBC: 13.1 10*3/uL — AB (ref 4.0–10.5)

## 2015-10-18 LAB — CULTURE, ROUTINE-ABSCESS: Gram Stain: NONE SEEN

## 2015-10-18 MED ORDER — METOPROLOL TARTRATE 25 MG PO TABS
25.0000 mg | ORAL_TABLET | Freq: Two times a day (BID) | ORAL | Status: DC
  Administered 2015-10-18 – 2015-10-19 (×3): 25 mg via ORAL
  Filled 2015-10-18 (×3): qty 1

## 2015-10-18 NOTE — Progress Notes (Signed)
  Subjective: Patient feels remarkably better over the past 24 hours. His abdominal distention has eased. He is tolerating a soft diet well.  Objective: Vital signs in last 24 hours: Temp:  [98.1 F (36.7 C)-98.7 F (37.1 C)] 98.6 F (37 C) (03/26 0631) Pulse Rate:  [95-104] 102 (03/26 0631) Resp:  [18] 18 (03/26 0631) BP: (117-145)/(73-83) 117/73 mmHg (03/26 0631) SpO2:  [97 %-98 %] 97 % (03/26 0631) Last BM Date: 10/17/15  Intake/Output from previous day: 03/25 0701 - 03/26 0700 In: 480 [P.O.:480] Out: 33 [Urine:2; Drains:30; Stool:1] Intake/Output this shift:    General appearance: alert, cooperative and no distress Resp: clear to auscultation bilaterally Cardio: regular rate and rhythm, S1, S2 normal, no murmur, click, rub or gallop GI: soft, non-tender; bowel sounds normal; no masses,  no organomegaly  Lab Results:   Recent Labs  10/17/15 0700 10/18/15 0713  WBC 19.0* 13.1*  HGB 12.6* 11.9*  HCT 36.4* 35.7*  PLT 360 433*   BMET  Recent Labs  10/17/15 0700  NA 135  K 3.6  CL 94*  CO2 28  GLUCOSE 127*  BUN 18  CREATININE 1.19  CALCIUM 8.4*   PT/INR No results for input(s): LABPROT, INR in the last 72 hours.  Studies/Results: No results found.  Anti-infectives: Anti-infectives    Start     Dose/Rate Route Frequency Ordered Stop   10/17/15 1300  ertapenem (INVANZ) 1 g in sodium chloride 0.9 % 50 mL IVPB     1 g 100 mL/hr over 30 Minutes Intravenous Every 24 hours 10/17/15 1117     10/11/15 0200  piperacillin-tazobactam (ZOSYN) IVPB 3.375 g  Status:  Discontinued     3.375 g 12.5 mL/hr over 240 Minutes Intravenous Every 8 hours 10/10/15 2048 10/17/15 1117   10/10/15 1730  piperacillin-tazobactam (ZOSYN) IVPB 3.375 g     3.375 g 100 mL/hr over 30 Minutes Intravenous  Once 10/10/15 1726 10/10/15 1914      Assessment/Plan: Impression: Perforated sigmoid diverticulitis with intra-abdominal abscess, resolving. Leukocytosis resolving on Invanz.  Blood pressure much better. Plan: We will continue to monitor over the next 24 hours. Should he continue to improve, I do anticipate discharge in next 24-48 hours. Should he worsen, he will undergo exploratory laparotomy.  LOS: 8 days    Cassie Shedlock A 10/18/2015

## 2015-10-19 LAB — CBC
HEMATOCRIT: 35 % — AB (ref 39.0–52.0)
Hemoglobin: 11.8 g/dL — ABNORMAL LOW (ref 13.0–17.0)
MCH: 31.4 pg (ref 26.0–34.0)
MCHC: 33.7 g/dL (ref 30.0–36.0)
MCV: 93.1 fL (ref 78.0–100.0)
PLATELETS: 389 10*3/uL (ref 150–400)
RBC: 3.76 MIL/uL — ABNORMAL LOW (ref 4.22–5.81)
RDW: 13.3 % (ref 11.5–15.5)
WBC: 9.5 10*3/uL (ref 4.0–10.5)

## 2015-10-19 LAB — GLUCOSE, CAPILLARY
GLUCOSE-CAPILLARY: 102 mg/dL — AB (ref 65–99)
Glucose-Capillary: 90 mg/dL (ref 65–99)

## 2015-10-19 MED ORDER — AMOXICILLIN-POT CLAVULANATE 875-125 MG PO TABS: 1.0000 | ORAL_TABLET | Freq: Two times a day (BID) | ORAL | Status: DC

## 2015-10-19 MED ORDER — OXYCODONE-ACETAMINOPHEN 7.5-325 MG PO TABS: 1.0000 | ORAL_TABLET | ORAL | Status: DC | PRN

## 2015-10-19 NOTE — Discharge Summary (Signed)
Physician Discharge Summary  Patient ID: Gabriel [name] MRN: [id number] DOB/AGE: 47/11/1970 47 y.o.  Admit date: 10/10/2015 Discharge date: 10/19/2015  Admission Diagnoses: Perforated appendicitis with intra-abdominal abscess  Discharge Diagnoses: Same, hypertension Active Problems:   Acute perforated appendicitis   Discharged Condition: good  Hospital Course: Patient is [name] 45 year old black male who presented to the emergency room with worsening lower abdominal pain. He presented on 10/10/2015. He was found on CT scan the abdomen to have acute perforated appendicitis. [name] surgery consultation was obtained and as the perforation was contained, he was admitted the hospital for IV antibiotics and pain control. He never developed [name] rigid abdomen. He did develop an ileus. Due to [name] rising white blood cell count and nonresolution ileus, the patient underwent percutaneous drainage of the intra-abdominal abscess by interventional radiology on 10/14/2015. He tolerated the procedure well. His antibiotic was adjusted from Zosyn to [facility]. Final cultures revealed group C strep. His leukocytosis has resolved. His ileus has resolved. He did have some mild hypertension which was never diagnosed previously. This seems to be also improving. He is being discharged home with the drain in place on 10/19/2015. He'll be followed by home health care.  Treatments: CT-guided drainage of intra-abdominal abscess from perforated appendicitis on 10/14/2015  Discharge Exam: Blood pressure 144/83, pulse 87, temperature 98.6 F (37 C), temperature source Oral, resp. rate 18, height 6' 2.5" (1.892 m), weight 116.847 kg (257 lb 9.6 oz), SpO2 96 %. General appearance: alert, cooperative and no distress Resp: clear to auscultation bilaterally Cardio: regular rate and rhythm, S1, S2 normal, no murmur, click, rub or gallop GI: Soft, minimal distention. No rigidity noted. Active bowel sounds appreciated. Purulent drainage noted within  the drainage tube.  Disposition: 01-Home or Self Care     Medication List    TAKE these medications        acetaminophen 500 MG tablet  Commonly known as:  TYLENOL  Take 1,000 mg by mouth every 6 (six) hours as needed for moderate pain.     amoxicillin-clavulanate 875-125 MG tablet  Commonly known as:  AUGMENTIN  Take 1 tablet by mouth 2 (two) times daily.     ondansetron 4 MG disintegrating tablet  Commonly known as:  ZOFRAN ODT  4mg  ODT q4 hours prn nausea/vomit     OVER THE COUNTER MEDICATION  Take 2 capsules by mouth daily. Hydroxycut     oxyCODONE-acetaminophen 7.5-325 MG tablet  Commonly known as:  PERCOCET  Take 1-2 tablets by mouth every 4 (four) hours as needed.           Follow-up Information    Follow up with Dalia HeadingJENKINS,Sharran Caratachea A, MD. Schedule an appointment as soon as possible for [name] visit on 10/22/2015.   Specialty:  General Surgery   Contact information:   [address] [place] [address] [phone]       Signed: Franky [name],Gabriel [name] 10/19/2015, 11:01 AM

## 2015-10-19 NOTE — Care Management (Signed)
Referral placed with [facility] for charitable nursing visits.

## 2015-10-19 NOTE — Discharge Instructions (Signed)
Appendicitis Appendicitis is when the appendix is swollen (inflamed). The inflammation can lead to developing a hole (perforation) and a collection of pus (abscess). CAUSES  There is not always an obvious cause of appendicitis. Sometimes it is caused by an obstruction in the appendix. The obstruction can be caused by:  A small, hard, pea-sized ball of stool (fecalith).  Enlarged lymph glands in the appendix. SYMPTOMS   Pain around your belly button (navel) that moves toward your lower right belly (abdomen). The pain can become more severe and sharp as time passes.  Tenderness in the lower right abdomen. Pain gets worse if you cough or make a sudden movement.  Feeling sick to your stomach (nauseous).  Throwing up (vomiting).  Loss of appetite.  Fever.  Constipation.  Diarrhea.  Generally not feeling well. DIAGNOSIS   Physical exam.  Blood tests.  Urine test.  X-rays or a CT scan may confirm the diagnosis. TREATMENT  Once the diagnosis of appendicitis is made, the most common treatment is to remove the appendix as soon as possible. This procedure is called appendectomy. In an open appendectomy, a cut (incision) is made in the lower right abdomen and the appendix is removed. In a laparoscopic appendectomy, usually 3 small incisions are made. Long, thin instruments and a camera tube are used to remove the appendix. Most patients go home in 24 to 48 hours after appendectomy. In some situations, the appendix may have already perforated and an abscess may have formed. The abscess may have a "wall" around it as seen on a CT scan. In this case, a drain may be placed into the abscess to remove fluid, and you may be treated with antibiotic medicines that kill germs. The medicine is given through a tube in your vein (IV). Once the abscess has resolved, it may or may not be necessary to have an appendectomy. You may need to stay in the hospital longer than 48 hours.   This information is  not intended to replace advice given to you by your health care provider. Make sure you discuss any questions you have with your health care provider.   Document Released: 07/11/2005 Document Revised: 01/10/2012 Document Reviewed: 11/26/2014 Elsevier Interactive Patient Education 2016 [employer]  

## 2015-10-19 NOTE — Care Management Note (Signed)
Case Management Note  Patient Details  Name: Gabriel [name] MRN: [id number] Date of Birth: 08/18/1970  Subjective/Objective:                Spoke with patient who is home from with mother. Patient will need RN for help with drain care for about 10 days. Patient will need indigent home health services.    Action/Plan: Home with home health.   Expected Discharge Date:                  Expected Discharge Plan:  Home w Home Health Services  In-House Referral:     Discharge planning Services  CM Consult  Post Acute Care Choice:    Choice offered to:     DME Arranged:    DME Agency:     HH Arranged:  RN HH Agency:  [facility]  Status of Service:  In process, will continue to follow  Medicare Important Message Given:    Date Medicare IM Given:    Medicare IM give by:    Date Additional Medicare IM Given:    Additional Medicare Important Message give by:     If discussed at Long Length of Stay Meetings, dates discussed:    Additional Comments:  Gabriel [name], Gabriel Rackley L, RN 10/19/2015, 11:48 AM

## 2015-10-19 NOTE — Progress Notes (Signed)
Patient discharged with instructions, prescription, and care notes.  Verbalized understanding via teach back.  IV was removed and the site was WNL. Patient voiced no further complaints or concerns at the time of discharge.  Appointments scheduled per instructions.  Patient left the floor via w/c with staff and family in stable condition. 

## 2015-10-21 LAB — CULTURE, BLOOD (SINGLE): Culture: NO GROWTH

## 2015-11-03 ENCOUNTER — Other Ambulatory Visit: Payer: Self-pay

## 2015-11-03 ENCOUNTER — Emergency Department (HOSPITAL_COMMUNITY): Payer: Self-pay

## 2015-11-03 ENCOUNTER — Encounter (HOSPITAL_COMMUNITY): Payer: Self-pay | Admitting: Emergency Medicine

## 2015-11-03 ENCOUNTER — Inpatient Hospital Stay (HOSPITAL_COMMUNITY)
Admission: EM | Admit: 2015-11-03 | Discharge: 2015-11-09 | DRG: 949 | Disposition: A | Discharge status: Home Health | Payer: Self-pay | Attending: General Surgery | Admitting: General Surgery

## 2015-11-03 DIAGNOSIS — T814XXD Infection following a procedure, subsequent encounter: Principal | ICD-10-CM

## 2015-11-03 DIAGNOSIS — B954 Other streptococcus as the cause of diseases classified elsewhere: Secondary | ICD-10-CM | POA: Diagnosis present

## 2015-11-03 DIAGNOSIS — K3532 Acute appendicitis with perforation and localized peritonitis, without abscess: Secondary | ICD-10-CM | POA: Diagnosis present

## 2015-11-03 DIAGNOSIS — K3533 Acute appendicitis with perforation and localized peritonitis, with abscess: Secondary | ICD-10-CM

## 2015-11-03 DIAGNOSIS — L24A9 Irritant contact dermatitis due friction or contact with other specified body fluids: Secondary | ICD-10-CM

## 2015-11-03 DIAGNOSIS — Y838 Other surgical procedures as the cause of abnormal reaction of the patient, or of later complication, without mention of misadventure at the time of the procedure: Secondary | ICD-10-CM | POA: Diagnosis present

## 2015-11-03 DIAGNOSIS — T148XXA Other injury of unspecified body region, initial encounter: Secondary | ICD-10-CM

## 2015-11-03 DIAGNOSIS — K353 Acute appendicitis with localized peritonitis: Secondary | ICD-10-CM | POA: Diagnosis present

## 2015-11-03 HISTORY — DX: Peritoneal abscess: K65.1

## 2015-11-03 HISTORY — DX: Other disturbances of skin sensation: R20.8

## 2015-11-03 HISTORY — DX: Unspecified appendicitis: K37

## 2015-11-03 HISTORY — DX: Anesthesia of skin: R20.0

## 2015-11-03 LAB — COMPREHENSIVE METABOLIC PANEL
ALT: 14 U/L — ABNORMAL LOW (ref 17–63)
ANION GAP: 10 (ref 5–15)
AST: 16 U/L (ref 15–41)
Albumin: 3.1 g/dL — ABNORMAL LOW (ref 3.5–5.0)
Alkaline Phosphatase: 61 U/L (ref 38–126)
BILIRUBIN TOTAL: 0.5 mg/dL (ref 0.3–1.2)
BUN: 8 mg/dL (ref 6–20)
CALCIUM: 8.3 mg/dL — AB (ref 8.9–10.3)
CO2: 23 mmol/L (ref 22–32)
Chloride: 103 mmol/L (ref 101–111)
Creatinine, Ser: 0.95 mg/dL (ref 0.61–1.24)
GFR calc Af Amer: >60 mL/min (ref >60)
Glucose, Bld: 117 mg/dL — ABNORMAL HIGH (ref 65–99)
POTASSIUM: 3.8 mmol/L (ref 3.5–5.1)
Sodium: 136 mmol/L (ref 135–145)
Total Protein: 7.5 g/dL (ref 6.5–8.1)

## 2015-11-03 LAB — CBC WITH DIFFERENTIAL/PLATELET
Basophils Absolute: 0 10*3/uL (ref 0.0–0.1)
Basophils Relative: 0 %
Eosinophils Absolute: 0.6 10*3/uL (ref 0.0–0.7)
Eosinophils Relative: 8 %
HEMATOCRIT: 37.1 % — AB (ref 39.0–52.0)
Hemoglobin: 12.6 g/dL — ABNORMAL LOW (ref 13.0–17.0)
LYMPHS PCT: 21 %
Lymphs Abs: 1.6 10*3/uL (ref 0.7–4.0)
MCH: 30.8 pg (ref 26.0–34.0)
MCHC: 34 g/dL (ref 30.0–36.0)
MCV: 90.7 fL (ref 78.0–100.0)
MONO ABS: 0.7 10*3/uL (ref 0.1–1.0)
MONOS PCT: 9 %
NEUTROS ABS: 4.6 10*3/uL (ref 1.7–7.7)
Neutrophils Relative %: 62 %
Platelets: 437 10*3/uL — ABNORMAL HIGH (ref 150–400)
RBC: 4.09 MIL/uL — ABNORMAL LOW (ref 4.22–5.81)
RDW: 12.5 % (ref 11.5–15.5)
WBC: 7.5 10*3/uL (ref 4.0–10.5)

## 2015-11-03 LAB — LACTIC ACID, PLASMA
Lactic Acid, Venous: 0.8 mmol/L (ref 0.5–2.0)
Lactic Acid, Venous: 0.8 mmol/L (ref 0.5–2.0)

## 2015-11-03 LAB — LIPASE, BLOOD: LIPASE: 46 U/L (ref 11–51)

## 2015-11-03 MED ORDER — SODIUM CHLORIDE 0.9 % IV SOLN
INTRAVENOUS | Status: DC
  Administered 2015-11-03 – 2015-11-07 (×9): via INTRAVENOUS
  Administered 2015-11-07: 1 mL via INTRAVENOUS
  Administered 2015-11-08: 14:00:00 via INTRAVENOUS

## 2015-11-03 MED ORDER — MORPHINE SULFATE (PF) 2 MG/ML IV SOLN
2.0000 mg | INTRAVENOUS | Status: DC | PRN
  Administered 2015-11-04 (×2): 4 mg via INTRAVENOUS
  Administered 2015-11-04: 2 mg via INTRAVENOUS
  Filled 2015-11-03: qty 2
  Filled 2015-11-03: qty 1
  Filled 2015-11-03 (×2): qty 2

## 2015-11-03 MED ORDER — PIPERACILLIN-TAZOBACTAM 3.375 G IVPB
3.3750 g | Freq: Three times a day (TID) | INTRAVENOUS | Status: DC
  Administered 2015-11-03 – 2015-11-09 (×17): 3.375 g via INTRAVENOUS
  Filled 2015-11-03 (×19): qty 50

## 2015-11-03 MED ORDER — SODIUM CHLORIDE 0.9 % IV BOLUS (SEPSIS)
1000.0000 mL | Freq: Once | INTRAVENOUS | Status: AC
Start: 2015-11-03 — End: 2015-11-03
  Administered 2015-11-03: 1000 mL via INTRAVENOUS

## 2015-11-03 MED ORDER — IOPAMIDOL (ISOVUE-300) INJECTION 61%
100.0000 mL | Freq: Once | INTRAVENOUS | Status: AC | PRN
  Administered 2015-11-03: 100 mL via INTRAVENOUS

## 2015-11-03 MED ORDER — ONDANSETRON 4 MG PO TBDP: 4.0000 mg | ORAL_TABLET | Freq: Four times a day (QID) | ORAL | Status: DC | PRN

## 2015-11-03 MED ORDER — PIPERACILLIN-TAZOBACTAM 3.375 G IVPB 30 MIN
3.3750 g | Freq: Once | INTRAVENOUS | Status: AC
  Administered 2015-11-03: 3.375 g via INTRAVENOUS
  Filled 2015-11-03: qty 50

## 2015-11-03 MED ORDER — ONDANSETRON HCL 4 MG/2ML IJ SOLN
4.0000 mg | Freq: Four times a day (QID) | INTRAMUSCULAR | Status: DC | PRN
Start: 2015-11-03 — End: 2015-11-09

## 2015-11-03 MED ORDER — DIATRIZOATE MEGLUMINE & SODIUM 66-10 % PO SOLN
ORAL | Status: AC
  Filled 2015-11-03: qty 30

## 2015-11-03 NOTE — H&P (Signed)
Gabriel [name] is an 45 y.o. male.   Chief Complaint: fatigue HPI: 45 yo male with recent history of hospitalization for perforated appendicitis treated with drain. The drain was empirically removed after resolution of symptoms 1 week ago. Afterwards there was green drainage from the percutaneous site for the next few days and the home health nurse assessed it today and recommended going to the ER for evaluation. He has undergone repeat CT scan showing a 4x7cm abscess in the RLQ. He denies nausea or vomiting, denies fevers or chills, his main complaint is feeling weak for the past 4 days.  Past Medical History  Diagnosis Date  . Appendicitis   . Abdominopelvic abscess ([place]) 09/2015    s/p perforated appendicitis    Past Surgical History  Procedure Laterality Date  . Drainage abd/periton abs perc ([facility] h[facility])  10/14/2015    IR at [facility]    History reviewed. No pertinent family history. Social History:  reports that he has never smoked. He does not have any smokeless tobacco history on file. He reports that he drinks alcohol. He reports that he does not use illicit drugs.  Allergies: No Known Allergies   (Not in a hospital admission)  Results for orders placed or performed during the hospital encounter of 11/03/15 (from the past 48 hour(s))  CBC with Differential     Status: Abnormal   Collection Time: 11/03/15  2:07 PM  Result Value Ref Range   WBC 7.5 4.0 - 10.5 K/uL   RBC 4.09 (L) 4.22 - 5.81 MIL/uL   Hemoglobin 12.6 (L) 13.0 - 17.0 g/dL   HCT 37.1 (L) 39.0 - 52.0 %   MCV 90.7 78.0 - 100.0 fL   MCH 30.8 26.0 - 34.0 pg   MCHC 34.0 30.0 - 36.0 g/dL   RDW 12.5 11.5 - 15.5 %   Platelets 437 (H) 150 - 400 K/uL   Neutrophils Relative % 62 %   Neutro Abs 4.6 1.7 - 7.7 K/uL   Lymphocytes Relative 21 %   Lymphs Abs 1.6 0.7 - 4.0 K/uL   Monocytes Relative 9 %   Monocytes Absolute 0.7 0.1 - 1.0 K/uL   Eosinophils Relative 8 %   Eosinophils Absolute 0.6 0.0 - 0.7 K/uL   Basophils Relative 0  %   Basophils Absolute 0.0 0.0 - 0.1 K/uL  Comprehensive metabolic panel     Status: Abnormal   Collection Time: 11/03/15  2:07 PM  Result Value Ref Range   Sodium 136 135 - 145 mmol/L   Potassium 3.8 3.5 - 5.1 mmol/L   Chloride 103 101 - 111 mmol/L   CO2 23 22 - 32 mmol/L   Glucose, Bld 117 (H) 65 - 99 mg/dL   BUN 8 6 - 20 mg/dL   Creatinine, Ser 0.95 0.61 - 1.24 mg/dL   Calcium 8.3 (L) 8.9 - 10.3 mg/dL   Total Protein 7.5 6.5 - 8.1 g/dL   Albumin 3.1 (L) 3.5 - 5.0 g/dL   AST 16 15 - 41 U/L   ALT 14 (L) 17 - 63 U/L   Alkaline Phosphatase 61 38 - 126 U/L   Total Bilirubin 0.5 0.3 - 1.2 mg/dL   GFR calc non Af Amer >60 >60 mL/min   GFR calc Af Amer >60 >60 mL/min    Comment: (NOTE) The eGFR has been calculated using the CKD EPI equation. This calculation has not been validated in all clinical situations. eGFR's persistently <60 mL/min signify possible Chronic Kidney Disease.  Anion gap 10 5 - 15  Lipase, blood     Status: None   Collection Time: 11/03/15  2:07 PM  Result Value Ref Range   Lipase 46 11 - 51 U/L  Lactic acid, plasma     Status: None   Collection Time: 11/03/15  2:07 PM  Result Value Ref Range   Lactic Acid, Venous 0.8 0.5 - 2.0 mmol/L  Lactic acid, plasma     Status: None   Collection Time: 11/03/15  5:11 PM  Result Value Ref Range   Lactic Acid, Venous 0.8 0.5 - 2.0 mmol/L   Ct Abdomen Pelvis W Contrast  11/03/2015  CLINICAL DATA:  History of perforated appendicitis with previous drain placement, drain has been removed and patient has continued drainage from wound. EXAM: CT ABDOMEN AND PELVIS WITH CONTRAST TECHNIQUE: Multidetector CT imaging of the abdomen and pelvis was performed using the standard protocol following bolus administration of intravenous contrast. CONTRAST:  154m ISOVUE-300 IOPAMIDOL (ISOVUE-300) INJECTION 61% COMPARISON:  10/14/2015, 10/13/2015 FINDINGS: Lung bases are free of acute infiltrate or sizable effusion. The liver, gallbladder,  spleen, adrenal glands and pancreas are stable in appearance from the prior exam. Kidneys are well visualized bilaterally within normal excretion pattern. In the right lower quadrant there is a persistent abscess cavity identified consistent with the known history of perforated appendicitis. It measures 6.3 by 4.1 cm in greatest dimension. Inflammatory changes course along the tract of the previous catheter to the back and there are some edematous changes in the left flank as well. Scanning into the pelvis demonstrates the bladder to be well distended. No free pelvic fluid is seen. The appendix remains inflamed similar to that seen previously. No acute bony abnormality is noted. IMPRESSION: Persistent right lower quadrant abscess secondary to perforated appendicitis. The appendix remains inflamed. There is inflammatory change along the tract of the previous drainage catheter is well as some inflammatory changes in the subcutaneous fat and left flank. Repeat drainage is likely appropriate. No other focal abnormality is seen. Electronically Signed   By: MInez CatalinaM.D.   On: 11/03/2015 17:21    Review of Systems  Constitutional: Positive for malaise/fatigue. Negative for fever and chills.  HENT: Negative for hearing loss.   Eyes: Negative for blurred vision and double vision.  Respiratory: Negative for cough and hemoptysis.   Cardiovascular: Negative for chest pain and palpitations.  Gastrointestinal: Positive for abdominal pain and constipation. Negative for nausea, vomiting and diarrhea.  Genitourinary: Negative for dysuria and urgency.  Musculoskeletal: Negative for myalgias and neck pain.  Skin: Negative for itching and rash.  Neurological: Negative for dizziness, tingling and headaches.  Endo/Heme/Allergies: Does not bruise/bleed easily.  Psychiatric/Behavioral: Negative for depression and suicidal ideas.    Blood pressure 161/100, pulse 106, temperature 98.5 F (36.9 C), temperature source  Oral, resp. rate 20, height '6\' 3"'$  (1.905 m), weight 105.643 kg (232 lb 14.4 oz), SpO2 100 %. Physical Exam  Constitutional: He is oriented to person, place, and time. He appears well-developed and well-nourished.  HENT:  Head: Normocephalic and atraumatic.  Eyes: Conjunctivae are normal. Pupils are equal, round, and reactive to light.  Neck: Normal range of motion. Neck supple.  Cardiovascular: Normal rate and regular rhythm.   Respiratory: Breath sounds normal.  GI: Soft. He exhibits no distension and no mass. There is tenderness. There is no rebound.  Musculoskeletal: He exhibits no edema or tenderness.  Neurological: He is alert and oriented to person, place, and time.  Skin: Skin  is warm and dry.  Psychiatric: He has a normal mood and affect. His behavior is normal.     Assessment/Plan 45 yo male with recurrent abscess from perforated appendicitis. -admit to surgery -plan for IR drainage procedure in AM -iv zosyn -pain control  Mickeal Skinner, MD 11/03/2015, 9:33 PM

## 2015-11-03 NOTE — ED Notes (Signed)
Report attempted again. Receiving RN's phone is not working, unable to get a hold of her at this time.

## 2015-11-03 NOTE — ED Provider Notes (Signed)
CSN: [id number]     Arrival date & time 11/03/15  1336 History   First MD Initiated Contact with Patient 11/03/15 1349     Chief Complaint  Patient presents with  . Wound Infection     HPI  Pt was seen at 1350. Per pt and his family, c/o gradual onset and persistence of constant "draining wound" for the past 1 week. Pt is s/p appendectomy for perforated appendicitis with intra-abd abscess on . Pt had drain placed by IR on 10/14/15. Pt states the drain was removed 1 week ago and "the wound keeps draining." Pt also completed his PO abx 1 week ago. Pt was evaluated by his Home Health RN today and was told to come to the ED for evaluation "because the wound looked like it was draining pus" and "my heart rate was fast." Denies abd pain, no back pain, no N/V/D, no fevers, no rash, no CP/SOB, no cough, no dysuria.     Past Medical History  Diagnosis Date  . Appendicitis   . Abdominopelvic abscess ([facility]) 09/2015    s/p perforated appendicitis   Past Surgical History  Procedure Laterality Date  . Drainage abd/periton abs perc ([facility] hx)  10/14/2015    IR at [facility]    Social History  Substance Use Topics  . Smoking status: Never Smoker   . Smokeless tobacco: None  . Alcohol Use: Yes     Comment: occ    Review of Systems ROS: Statement: All systems negative except as marked or noted in the HPI; Constitutional: Negative for fever and chills. ; ; Eyes: Negative for eye pain, redness and discharge. ; ; ENMT: Negative for ear pain, hoarseness, nasal congestion, sinus pressure and sore throat. ; ; Cardiovascular: Negative for chest pain, palpitations, diaphoresis, dyspnea and peripheral edema. ; ; Respiratory: Negative for cough, wheezing and stridor. ; ; Gastrointestinal: Negative for nausea, vomiting, diarrhea, abdominal pain, blood in stool, hematemesis, jaundice and rectal bleeding. . ; ; Genitourinary: Negative for dysuria, flank pain and hematuria. ; ; Musculoskeletal: Negative for back pain and neck  pain. Negative for swelling and trauma.; ; Skin: +wound drainage. Negative for pruritus, rash, abrasions, blisters, bruising and skin lesion.; ; Neuro: Negative for headache, lightheadedness and neck stiffness. Negative for weakness, altered level of consciousness , altered mental status, extremity weakness, paresthesias, involuntary movement, seizure and syncope.      Allergies  Review of patient's allergies indicates no known allergies.  Home Medications   Prior to Admission medications   Medication Sig Start Date End Date Taking? Authorizing Provider  acetaminophen (TYLENOL) 500 MG tablet Take 1,000 mg by mouth every 6 (six) hours as needed for moderate pain.   Yes Historical Provider, MD  oxyCODONE-acetaminophen (PERCOCET) 7.5-325 MG tablet Take 1-2 tablets by mouth every 4 (four) hours as needed. 10/19/15  Yes Franky Macho, MD   BP 131/86 mmHg  Pulse 120  Temp(Src) 97.5 F (36.4 C) (Oral)  Resp 16  Ht  (1.905 m)  Wt 232 lb 14.4 oz (105.643 kg)  BMI 29.11 kg/m2  SpO2 100%   Patient Vitals for the past 24 hrs:  BP Temp Temp src Pulse Resp SpO2 Height Weight  11/03/15 1500 139/90 mmHg - - 96 16 97 % - -  11/03/15 1430 134/95 mmHg - - 95 16 96 % - -  11/03/15 1340 131/86 mmHg 97.5 F (36.4 C) Oral 120 16 100 %  (1.905 m) 232 lb 14.4 oz (105.643 kg)  Physical Exam  1355: Physical examination:  Nursing notes reviewed; Vital signs and O2 SAT reviewed;  Constitutional: Well developed, Well nourished, Well hydrated, In no acute distress; Head:  Normocephalic, atraumatic; Eyes: EOMI, PERRL, No scleral icterus; ENMT: Mouth and pharynx normal, Mucous membranes moist; Neck: Supple, Full range of motion, No lymphadenopathy; Cardiovascular: Tachycardic rate and rhythm, No gallop; Respiratory: Breath sounds clear & equal bilaterally, No rales, rhonchi, wheezes.  Speaking full sentences with ease, Normal respiratory effort/excursion; Chest: Nontender, Movement normal; Abdomen:  Soft, Nontender, Nondistended, Normal bowel sounds; Genitourinary: No CVA tenderness; Spine:  No midline CS, TS, LS tenderness. +small healing wound right flank without surrounding erythema, ecchymosis, fluctuance or soft tissue crepitus. Wound itself has good granulation tissue. When the wound itself and the surrounding tissues are palpated, purulent fluid is expressed from an opening in the center of the wound. No bleeding;;  Extremities: Pulses normal, No tenderness, No edema, No calf edema or asymmetry.; Neuro: AA&Ox3, Major CN grossly intact.  Speech clear. No gross focal motor or sensory deficits in extremities.; Skin: Color normal, Warm, Dry.   ED Course  Procedures (including critical care time) Labs Review  Imaging Review  I have personally reviewed and evaluated these images and lab results as part of my medical decision-making.   EKG Interpretation   Date/Time:  Tuesday November 03 2015 13:44:48 EDT Ventricular Rate:  121 PR Interval:  158 QRS Duration: 86 QT Interval:  304 QTC Calculation: 431 R Axis:   29 Text Interpretation:  Sinus tachycardia Otherwise normal ECG No old  tracing to compare Confirmed by [facility]  MD, Nicholos Johns [phone]) on 11/03/2015  3:32:03 PM      MDM  MDM Reviewed: previous chart, nursing note and vitals Reviewed previous: labs Interpretation: labs and CT scan     Results for orders placed or performed during the hospital encounter of 11/03/15  CBC with Differential  Result Value Ref Range   WBC 7.5 4.0 - 10.5 K/uL   RBC 4.09 (L) 4.22 - 5.81 MIL/uL   Hemoglobin 12.6 (L) 13.0 - 17.0 g/dL   HCT 91.4 (L) 78.2 - 95.6 %   MCV 90.7 78.0 - 100.0 fL   MCH 30.8 26.0 - 34.0 pg   MCHC 34.0 30.0 - 36.0 g/dL   RDW 21.3 08.6 - 57.8 %   Platelets 437 (H) 150 - 400 K/uL   Neutrophils Relative % 62 %   Neutro Abs 4.6 1.7 - 7.7 K/uL   Lymphocytes Relative 21 %   Lymphs Abs 1.6 0.7 - 4.0 K/uL   Monocytes Relative 9 %   Monocytes Absolute 0.7 0.1 - 1.0 K/uL    Eosinophils Relative 8 %   Eosinophils Absolute 0.6 0.0 - 0.7 K/uL   Basophils Relative 0 %   Basophils Absolute 0.0 0.0 - 0.1 K/uL  Comprehensive metabolic panel  Result Value Ref Range   Sodium 136 135 - 145 mmol/L   Potassium 3.8 3.5 - 5.1 mmol/L   Chloride 103 101 - 111 mmol/L   CO2 23 22 - 32 mmol/L   Glucose, Bld 117 (H) 65 - 99 mg/dL   BUN 8 6 - 20 mg/dL   Creatinine, Ser 4.69 0.61 - 1.24 mg/dL   Calcium 8.3 (L) 8.9 - 10.3 mg/dL   Total Protein 7.5 6.5 - 8.1 g/dL   Albumin 3.1 (L) 3.5 - 5.0 g/dL   AST 16 15 - 41 U/L   ALT 14 (L) 17 - 63 U/L   Alkaline Phosphatase 61  38 - 126 U/L   Total Bilirubin 0.5 0.3 - 1.2 mg/dL   GFR calc non Af Amer >60 >60 mL/min   GFR calc Af Amer >60 >60 mL/min   Anion gap 10 5 - 15  Lipase, blood  Result Value Ref Range   Lipase 46 11 - 51 U/L  Lactic acid, plasma  Result Value Ref Range   Lactic Acid, Venous 0.8 0.5 - 2.0 mmol/L    1620:  Tachycardia improved after IVF. Pt remains afebrile. CT A/P and Udip pending. Dispo based on results. Sign out to Dr. Juleen ChinaKohut.   Samuel JesterKathleen Aspyn Warnke, DO 11/03/15 1625

## 2015-11-03 NOTE — ED Notes (Addendum)
PT c/o continued drainage from drain that was removed from right lower back x7 days ago from infection s/p from appendicitis rupture. PT states his home health nurse told him to come to ED for eval d/t general surgeon office closed today and drainage looks infected and pt had some tachycardia with no fever. PT states his home po antibiotics are completed.

## 2015-11-03 NOTE — ED Notes (Signed)
Heart rate 120's 130's in triage.  EKG done.  Denies any chest pain.

## 2015-11-03 NOTE — ED Notes (Signed)
MD at bedside. 

## 2015-11-03 NOTE — ED Notes (Signed)
Report attempted. Receiving RN not assigned to patient yet.
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
## 2015-11-04 ENCOUNTER — Inpatient Hospital Stay (HOSPITAL_COMMUNITY): Payer: Self-pay

## 2015-11-04 LAB — URINALYSIS, ROUTINE W REFLEX MICROSCOPIC
BILIRUBIN URINE: NEGATIVE
GLUCOSE, UA: NEGATIVE mg/dL
Hgb urine dipstick: NEGATIVE
KETONES UR: 40 mg/dL — AB
Leukocytes, UA: NEGATIVE
NITRITE: NEGATIVE
PH: 5.5 (ref 5.0–8.0)
Protein, ur: NEGATIVE mg/dL
Specific Gravity, Urine: >1.046 — ABNORMAL HIGH (ref 1.005–1.030)

## 2015-11-04 LAB — PROTIME-INR
INR: 1.38 (ref 0.00–1.49)
Prothrombin Time: 17 seconds — ABNORMAL HIGH (ref 11.6–15.2)

## 2015-11-04 LAB — APTT: aPTT: 36 seconds (ref 24–37)

## 2015-11-04 MED ORDER — FENTANYL CITRATE (PF) 100 MCG/2ML IJ SOLN
INTRAMUSCULAR | Status: AC
  Filled 2015-11-04: qty 4

## 2015-11-04 MED ORDER — HYDROMORPHONE HCL 1 MG/ML IJ SOLN
0.5000 mg | INTRAMUSCULAR | Status: DC | PRN
Start: 2015-11-04 — End: 2015-11-05
  Administered 2015-11-04 – 2015-11-05 (×4): 1 mg via INTRAVENOUS
  Filled 2015-11-04 (×4): qty 1

## 2015-11-04 MED ORDER — MIDAZOLAM HCL 2 MG/2ML IJ SOLN
INTRAMUSCULAR | Status: AC | PRN
  Administered 2015-11-04 (×2): 1 mg via INTRAVENOUS

## 2015-11-04 MED ORDER — MIDAZOLAM HCL 2 MG/2ML IJ SOLN
INTRAMUSCULAR | Status: AC
  Filled 2015-11-04: qty 4

## 2015-11-04 MED ORDER — FENTANYL CITRATE (PF) 100 MCG/2ML IJ SOLN
INTRAMUSCULAR | Status: AC | PRN
  Administered 2015-11-04 (×2): 50 ug via INTRAVENOUS

## 2015-11-04 MED ORDER — LIDOCAINE HCL 1 % IJ SOLN
INTRAMUSCULAR | Status: AC
  Filled 2015-11-04: qty 20

## 2015-11-04 NOTE — Progress Notes (Signed)
Initial Nutrition Assessment  DOCUMENTATION CODES:   Not applicable  INTERVENTION:   -RD will follow for diet advancement and supplement as appropriate  NUTRITION DIAGNOSIS:   Inadequate oral intake related to inability to eat as evidenced by NPO status.  GOAL:   Patient will meet greater than or equal to 90% of their needs  MONITOR:   Diet advancement, Labs, Weight trends, Skin, I & O's  REASON FOR ASSESSMENT:   Malnutrition Screening Tool    ASSESSMENT:   45 yo male with recent history of hospitalization for perforated appendicitis treated with drain. The drain was empirically removed after resolution of symptoms 1 week ago. Afterwards there was green drainage from the percutaneous site for the next few days and the home health nurse assessed it today and recommended going to the ER for evaluation. He has undergone repeat CT scan showing a 4x7cm abscess in the RLQ. He denies nausea or vomiting, denies fevers or chills, his main complaint is feeling weak for the past 4 days.  Pt admitted with recurrent abscess with perforated appendicitis..   Pt underwent drain placement with IR this AM.   Spoke with pt at bedside. He reports that he has lost approximately 40# since the beginning for the year. He estimates he has lost about 20-25# intentionally with lifestyle modifications; he shares he has been going to the gym regularly and cut out alcohol, refined carbohydrates, and consumed more lean proteins, fruits, and vegetables. Pt reports he has since lost another 10-20# over the past 3-4 weeks, due to hospitalization. He reveals he was discharged home on a liquid diet and resumed previous home diet for the past few weeks but was eating less. Per documented wt hx, pt has experienced a 25# (9.7%) wt loss over the past month.   Nutrition-Focused physical exam completed. Findings are no fat depletion, no muscle depletion, and no edema.   Pt understanding of rationale for NPO diet order  and discussed rationale for diet advancement. Pt denies any further nutritional concerns at this time.   Labs reviewed.   Diet Order:  Diet NPO time specified Except for: Ice Chips  Skin:  Reviewed, no issues (closed abdominal incision)  Last BM:  11/03/15  Height:   Ht Readings from Last 1 Encounters:  11/03/15 6\' 3"  (1.905 m)    Weight:   Wt Readings from Last 1 Encounters:  11/03/15 232 lb 12.9 oz (105.6 kg)    Ideal Body Weight:  89.1 kg  BMI:  Body mass index is 29.1 kg/(m^2).  Estimated Nutritional Needs:   Kcal:  1900-2100  Protein:  90-105 grams  Fluid:  >1.9 L  EDUCATION NEEDS:   No education needs identified at this time  Mindie Rawdon A. Mayford KnifeWilliams, RD, LDN, CDE Pager: [phone] After hours Pager: [phone]

## 2015-11-04 NOTE — Sedation Documentation (Addendum)
Patient denies pain and is resting comfortably. Awaiting transport.  

## 2015-11-04 NOTE — Sedation Documentation (Signed)
Patient is resting comfortably. 

## 2015-11-04 NOTE — Progress Notes (Signed)
Patient ID: Gabriel [name], male   DOB: 06/04/1971, 45 y.o.   MRN: [id number]     [address] [place], [place] [address]    Phone: [phone] FAX: [phone]     Subjective: Afebrile.  Vss.  Feels bloated, passing flatus. No n/v.   Objective:  Vital signs:  Filed Vitals:   11/03/15 1903 11/03/15 2034 11/03/15 2224 11/04/15 0623  BP: 139/92 161/100 140/88 136/76  Pulse: 96 106 110 88  Temp: 98.8 F (37.1 C) 98.5 F (36.9 C) 98.2 F (36.8 C) 99.4 F (37.4 C)  TempSrc: Oral Oral Oral Oral  Resp: _0 Height:   _1  (1.905 m)   Weight:   105.6 kg (232 lb 12.9 oz)   SpO2: 97% 100% 100% 100%    Last BM Date: 11/03/15  Intake/Output   Yesterday:  04/11 0701 - 04/12 0700 In: -  Out: 2 [Urine:1; Stool:1] This shift: I/O last 3 completed shifts: In: -  Out: 2 [Urine:1; Stool:1]   Physical Exam: General: Pt awake/alert/oriented x4 in no acute distress Chest: cta.  No chest wall pain w good excursion CV:  Pulses intact.  Regular rhythm MS: Normal AROM mjr joints.  No obvious deformity Abdomen: Soft.  Nondistended.  Non tender.  No evidence of peritonitis.  No incarcerated hernias. Ext:  SCDs BLE.  No mjr edema.  No cyanosis Skin: No petechiae / purpura   Problem List:   Active Problems:   Perforated appendicitis    Results:   Labs: Results for orders placed or performed during the hospital encounter of 11/03/15 (from the past 48 hour(s))  CBC with Differential     Status: Abnormal   Collection Time: 11/03/15  2:07 PM  Result Value Ref Range   WBC 7.5 4.0 - 10.5 K/uL   RBC 4.09 (L) 4.22 - 5.81 MIL/uL   Hemoglobin 12.6 (L) 13.0 - 17.0 g/dL   HCT 37.1 (L) 39.0 - 52.0 %   MCV 90.7 78.0 - 100.0 fL   MCH 30.8 26.0 - 34.0 pg   MCHC 34.0 30.0 - 36.0 g/dL   RDW 12.5 11.5 - 15.5 %   Platelets 437 (H) 150 - 400 K/uL   Neutrophils Relative % 62 %   Neutro Abs 4.6 1.7 - 7.7 K/uL    Lymphocytes Relative 21 %   Lymphs Abs 1.6 0.7 - 4.0 K/uL   Monocytes Relative 9 %   Monocytes Absolute 0.7 0.1 - 1.0 K/uL   Eosinophils Relative 8 %   Eosinophils Absolute 0.6 0.0 - 0.7 K/uL   Basophils Relative 0 %   Basophils Absolute 0.0 0.0 - 0.1 K/uL  Comprehensive metabolic panel     Status: Abnormal   Collection Time: 11/03/15  2:07 PM  Result Value Ref Range   Sodium 136 135 - 145 mmol/L   Potassium 3.8 3.5 - 5.1 mmol/L   Chloride 103 101 - 111 mmol/L   CO2 23 22 - 32 mmol/L   Glucose, Bld 117 (H) 65 - 99 mg/dL   BUN 8 6 - 20 mg/dL   Creatinine, Ser 0.95 0.61 - 1.24 mg/dL   Calcium 8.3 (L) 8.9 - 10.3 mg/dL   Total Protein 7.5 6.5 - 8.1 g/dL   Albumin 3.1 (L) 3.5 - 5.0 g/dL   AST 16 15 - 41 U/L   ALT 14 (L) 17 - 63 U/L   Alkaline Phosphatase 61  38 - 126 U/L   Total Bilirubin 0.5 0.3 - 1.2 mg/dL   GFR calc non Af Amer >60 >60 mL/min   GFR calc Af Amer >60 >60 mL/min    Comment: (NOTE) The eGFR has been calculated using the CKD EPI equation. This calculation has not been validated in all clinical situations. eGFR's persistently <60 mL/min signify possible Chronic Kidney Disease.    Anion gap 10 5 - 15  Lipase, blood     Status: None   Collection Time: 11/03/15  2:07 PM  Result Value Ref Range   Lipase 46 11 - 51 U/L  Lactic acid, plasma     Status: None   Collection Time: 11/03/15  2:07 PM  Result Value Ref Range   Lactic Acid, Venous 0.8 0.5 - 2.0 mmol/L  Lactic acid, plasma     Status: None   Collection Time: 11/03/15  5:11 PM  Result Value Ref Range   Lactic Acid, Venous 0.8 0.5 - 2.0 mmol/L  Urinalysis, Routine w reflex microscopic     Status: Abnormal   Collection Time: 11/04/15 12:09 AM  Result Value Ref Range   Color, Urine YELLOW YELLOW   APPearance CLEAR CLEAR   Specific Gravity, Urine >1.046 (H) 1.005 - 1.030   pH 5.5 5.0 - 8.0   Glucose, UA NEGATIVE NEGATIVE mg/dL   Hgb urine dipstick NEGATIVE NEGATIVE   Bilirubin Urine NEGATIVE NEGATIVE    Ketones, ur 40 (A) NEGATIVE mg/dL   Protein, ur NEGATIVE NEGATIVE mg/dL   Nitrite NEGATIVE NEGATIVE   Leukocytes, UA NEGATIVE NEGATIVE    Comment: MICROSCOPIC NOT DONE ON URINES WITH NEGATIVE PROTEIN, BLOOD, LEUKOCYTES, NITRITE, OR GLUCOSE <1000 mg/dL.  APTT     Status: None   Collection Time: 11/04/15  6:52 AM  Result Value Ref Range   aPTT 36 24 - 37 seconds  Protime-INR     Status: Abnormal   Collection Time: 11/04/15  6:52 AM  Result Value Ref Range   Prothrombin Time 17.0 (H) 11.6 - 15.2 seconds   INR 1.38 0.00 - 1.49    Imaging / Studies: Ct Abdomen Pelvis W Contrast  11/03/2015  CLINICAL DATA:  History of perforated appendicitis with previous drain placement, drain has been removed and patient has continued drainage from wound. EXAM: CT ABDOMEN AND PELVIS WITH CONTRAST TECHNIQUE: Multidetector CT imaging of the abdomen and pelvis was performed using the standard protocol following bolus administration of intravenous contrast. CONTRAST:  179m ISOVUE-300 IOPAMIDOL (ISOVUE-300) INJECTION 61% COMPARISON:  10/14/2015, 10/13/2015 FINDINGS: Lung bases are free of acute infiltrate or sizable effusion. The liver, gallbladder, spleen, adrenal glands and pancreas are stable in appearance from the prior exam. Kidneys are well visualized bilaterally within normal excretion pattern. In the right lower quadrant there is a persistent abscess cavity identified consistent with the known history of perforated appendicitis. It measures 6.3 by 4.1 cm in greatest dimension. Inflammatory changes course along the tract of the previous catheter to the back and there are some edematous changes in the left flank as well. Scanning into the pelvis demonstrates the bladder to be well distended. No free pelvic fluid is seen. The appendix remains inflamed similar to that seen previously. No acute bony abnormality is noted. IMPRESSION: Persistent right lower quadrant abscess secondary to perforated appendicitis. The  appendix remains inflamed. There is inflammatory change along the tract of the previous drainage catheter is well as some inflammatory changes in the subcutaneous fat and left flank. Repeat drainage is likely appropriate. No  other focal abnormality is seen. Electronically Signed   By: Inez Catalina M.D.   On: 11/03/2015 17:21    Medications / Allergies:  Scheduled Meds: . piperacillin-tazobactam (ZOSYN)  IV  3.375 g Intravenous 3 times per day   Continuous Infusions: . sodium chloride 125 mL/hr at 11/04/15 0815   PRN Meds:.morphine injection, ondansetron **OR** ondansetron (ZOFRAN) IV  Antibiotics: Anti-infectives    Start     Dose/Rate Route Frequency Ordered Stop   11/03/15 2145  piperacillin-tazobactam (ZOSYN) IVPB 3.375 g     3.375 g 12.5 mL/hr over 240 Minutes Intravenous 3 times per day 11/03/15 2133     11/03/15 1800  piperacillin-tazobactam (ZOSYN) IVPB 3.375 g     3.375 g 100 mL/hr over 30 Minutes Intravenous  Once 11/03/15 1753 11/03/15 1839        Assessment/Plan Perforated appendicitis with abscess -IR consult for drain placement FEN-IVF, NPO ID-zosyn VTE prophylaxis-SCD, add lovenox after drain is placed Dispo-OP f/u with Dr. Tomie China, [facility] Pager [phone](7A-4:30P) For consults and floor pages call [phone](7A-4:30P)  11/04/2015 8:51 AM
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
## 2015-11-04 NOTE — Procedures (Signed)
Successful CT RT ABDOMINAL ABSCESS DRAIN INSERTION No comp Stable 60 cc pus aspirated Gs/cx sent Keep to suction bulb Full report in PACS

## 2015-11-05 LAB — URINE CULTURE: Culture: NO GROWTH

## 2015-11-05 MED ORDER — HYDROMORPHONE HCL 1 MG/ML IJ SOLN
0.5000 mg | INTRAMUSCULAR | Status: DC | PRN
  Administered 2015-11-05 – 2015-11-06 (×3): 1 mg via INTRAVENOUS
  Administered 2015-11-07 (×4): 2 mg via INTRAVENOUS
  Administered 2015-11-08 – 2015-11-09 (×3): 1 mg via INTRAVENOUS
  Filled 2015-11-05: qty 2
  Filled 2015-11-05: qty 1
  Filled 2015-11-05: qty 2
  Filled 2015-11-05 (×2): qty 1
  Filled 2015-11-05: qty 2
  Filled 2015-11-05 (×3): qty 1
  Filled 2015-11-05: qty 2

## 2015-11-05 MED ORDER — ENOXAPARIN SODIUM 40 MG/0.4ML ~~LOC~~ SOLN
40.0000 mg | SUBCUTANEOUS | Status: DC
Start: 2015-11-05 — End: 2015-11-09
  Administered 2015-11-05 – 2015-11-08 (×4): 40 mg via SUBCUTANEOUS
  Filled 2015-11-05 (×4): qty 0.4

## 2015-11-05 MED ORDER — IBUPROFEN 800 MG PO TABS: 800.0000 mg | ORAL_TABLET | Freq: Four times a day (QID) | ORAL | Status: DC | PRN

## 2015-11-05 MED ORDER — CETYLPYRIDINIUM CHLORIDE 0.05 % MT LIQD
7.0000 mL | Freq: Two times a day (BID) | OROMUCOSAL | Status: DC
  Administered 2015-11-05 – 2015-11-08 (×3): 7 mL via OROMUCOSAL

## 2015-11-05 MED ORDER — OXYCODONE-ACETAMINOPHEN 5-325 MG PO TABS
1.0000 | ORAL_TABLET | ORAL | Status: DC | PRN
  Administered 2015-11-05: 2 via ORAL
  Administered 2015-11-05: 1 via ORAL
  Administered 2015-11-06 – 2015-11-09 (×5): 2 via ORAL
  Filled 2015-11-05 (×2): qty 2
  Filled 2015-11-05: qty 1
  Filled 2015-11-05 (×5): qty 2

## 2015-11-05 NOTE — Progress Notes (Signed)
Pt. Stated he will gives hes independent bath later on today probably after lunch everything is set up for him in the room. Will call if he needs any assistant.

## 2015-11-05 NOTE — Progress Notes (Signed)
Patient ID: Gabriel [name], male   DOB: May 02, 1971, 45 y.o.   MRN: [id number]     [address] [place], [place] [address]    Phone: [phone] FAX: [phone]     Subjective: Afebrile.  Tolerating clears. Voiding. Still in pain.  VSS. 225m drain output.   Objective:  Vital signs:  Filed Vitals:   11/04/15 1458 11/04/15 2115 11/05/15 0622 11/05/15 0957  BP: 136/84 135/88 138/84 140/90  Pulse: 84 97 96 93  Temp: 98.4 F (36.9 C) 98.7 F (37.1 C) 99.6 F (37.6 C) 99.1 F (37.3 C)  TempSrc: Oral Oral Oral Oral  Resp: '18 18 18 16  '$ Height:      Weight:      SpO2: 96% 97% 96% 97%    Last BM Date: 11/03/15  Intake/Output   Yesterday:  04/12 0701 - 04/13 0700 In: 17619[P.O.:840; I.V.:817; IV Piggyback:100] Out: 209 [Urine:4; Drains:205] This shift:  Total I/O In: -  Out: 20 [Drains:20]   Physical Exam: General: Pt awake/alert/oriented x4 in no acute distress Chest: cta. No chest wall pain w good excursion CV: Pulses intact. Regular rhythm MS: Normal AROM mjr joints. No obvious deformity Abdomen: Soft. Nondistended. Non tender. No evidence of peritonitis.JP drain with cloudy serosang output(just emptied).  No incarcerated hernias. Ext: SCDs BLE. No mjr edema. No cyanosis Skin: No petechiae / purpura  Problem List:   Active Problems:   Perforated appendicitis    Results:   Labs: Results for orders placed or performed during the hospital encounter of 11/03/15 (from the past 48 hour(s))  CBC with Differential     Status: Abnormal   Collection Time: 11/03/15  2:07 PM  Result Value Ref Range   WBC 7.5 4.0 - 10.5 K/uL   RBC 4.09 (L) 4.22 - 5.81 MIL/uL   Hemoglobin 12.6 (L) 13.0 - 17.0 g/dL   HCT 37.1 (L) 39.0 - 52.0 %   MCV 90.7 78.0 - 100.0 fL   MCH 30.8 26.0 - 34.0 pg   MCHC 34.0 30.0 - 36.0 g/dL   RDW 12.5 11.5 - 15.5 %   Platelets 437 (H) 150 - 400 K/uL   Neutrophils  Relative % 62 %   Neutro Abs 4.6 1.7 - 7.7 K/uL   Lymphocytes Relative 21 %   Lymphs Abs 1.6 0.7 - 4.0 K/uL   Monocytes Relative 9 %   Monocytes Absolute 0.7 0.1 - 1.0 K/uL   Eosinophils Relative 8 %   Eosinophils Absolute 0.6 0.0 - 0.7 K/uL   Basophils Relative 0 %   Basophils Absolute 0.0 0.0 - 0.1 K/uL  Comprehensive metabolic panel     Status: Abnormal   Collection Time: 11/03/15  2:07 PM  Result Value Ref Range   Sodium 136 135 - 145 mmol/L   Potassium 3.8 3.5 - 5.1 mmol/L   Chloride 103 101 - 111 mmol/L   CO2 23 22 - 32 mmol/L   Glucose, Bld 117 (H) 65 - 99 mg/dL   BUN 8 6 - 20 mg/dL   Creatinine, Ser 0.95 0.61 - 1.24 mg/dL   Calcium 8.3 (L) 8.9 - 10.3 mg/dL   Total Protein 7.5 6.5 - 8.1 g/dL   Albumin 3.1 (L) 3.5 - 5.0 g/dL   AST 16 15 - 41 U/L   ALT 14 (L) 17 - 63 U/L   Alkaline Phosphatase 61 38 - 126 U/L   Total Bilirubin 0.5 0.3 -  1.2 mg/dL   GFR calc non Af Amer >60 >60 mL/min   GFR calc Af Amer >60 >60 mL/min    Comment: (NOTE) The eGFR has been calculated using the CKD EPI equation. This calculation has not been validated in all clinical situations. eGFR's persistently <60 mL/min signify possible Chronic Kidney Disease.    Anion gap 10 5 - 15  Lipase, blood     Status: None   Collection Time: 11/03/15  2:07 PM  Result Value Ref Range   Lipase 46 11 - 51 U/L  Lactic acid, plasma     Status: None   Collection Time: 11/03/15  2:07 PM  Result Value Ref Range   Lactic Acid, Venous 0.8 0.5 - 2.0 mmol/L  Lactic acid, plasma     Status: None   Collection Time: 11/03/15  5:11 PM  Result Value Ref Range   Lactic Acid, Venous 0.8 0.5 - 2.0 mmol/L  Urinalysis, Routine w reflex microscopic     Status: Abnormal   Collection Time: 11/04/15 12:09 AM  Result Value Ref Range   Color, Urine YELLOW YELLOW   APPearance CLEAR CLEAR   Specific Gravity, Urine >1.046 (H) 1.005 - 1.030   pH 5.5 5.0 - 8.0   Glucose, UA NEGATIVE NEGATIVE mg/dL   Hgb urine dipstick NEGATIVE  NEGATIVE   Bilirubin Urine NEGATIVE NEGATIVE   Ketones, ur 40 (A) NEGATIVE mg/dL   Protein, ur NEGATIVE NEGATIVE mg/dL   Nitrite NEGATIVE NEGATIVE   Leukocytes, UA NEGATIVE NEGATIVE    Comment: MICROSCOPIC NOT DONE ON URINES WITH NEGATIVE PROTEIN, BLOOD, LEUKOCYTES, NITRITE, OR GLUCOSE <1000 mg/dL.  Urine culture     Status: None   Collection Time: 11/04/15 12:09 AM  Result Value Ref Range   Specimen Description URINE, CLEAN CATCH    Special Requests NONE    Culture NO GROWTH 1 DAY    Report Status 11/05/2015 FINAL   APTT     Status: None   Collection Time: 11/04/15  6:52 AM  Result Value Ref Range   aPTT 36 24 - 37 seconds  Protime-INR     Status: Abnormal   Collection Time: 11/04/15  6:52 AM  Result Value Ref Range   Prothrombin Time 17.0 (H) 11.6 - 15.2 seconds   INR 1.38 0.00 - 1.49  Culture, routine-abscess     Status: None (Preliminary result)   Collection Time: 11/04/15 12:17 PM  Result Value Ref Range   Specimen Description ABSCESS ABDOMEN    Special Requests NONE    Gram Stain      ABUNDANT WBC PRESENT,BOTH PMN AND MONONUCLEAR NO SQUAMOUS EPITHELIAL CELLS SEEN ABUNDANT GRAM POSITIVE RODS ABUNDANT GRAM NEGATIVE RODS MODERATE GRAM POSITIVE COCCI IN PAIRS Performed at [facility]    Culture PENDING    Report Status PENDING   Anaerobic culture     Status: None (Preliminary result)   Collection Time: 11/04/15 12:17 PM  Result Value Ref Range   Specimen Description ABSCESS ABDOMEN    Special Requests NONE    Gram Stain      ABUNDANT WBC PRESENT,BOTH PMN AND MONONUCLEAR NO SQUAMOUS EPITHELIAL CELLS SEEN ABUNDANT GRAM POSITIVE RODS ABUNDANT GRAM NEGATIVE RODS MODERATE GRAM POSITIVE COCCI IN PAIRS Performed at [facility]    Culture PENDING    Report Status PENDING     Imaging / Studies: Ct Abdomen Pelvis W Contrast  11/03/2015  CLINICAL DATA:  History of perforated appendicitis with previous drain placement, drain has been removed and  patient has continued drainage from wound. EXAM: CT ABDOMEN AND PELVIS WITH CONTRAST TECHNIQUE: Multidetector CT imaging of the abdomen and pelvis was performed using the standard protocol following bolus administration of intravenous contrast. CONTRAST:  165m ISOVUE-300 IOPAMIDOL (ISOVUE-300) INJECTION 61% COMPARISON:  10/14/2015, 10/13/2015 FINDINGS: Lung bases are free of acute infiltrate or sizable effusion. The liver, gallbladder, spleen, adrenal glands and pancreas are stable in appearance from the prior exam. Kidneys are well visualized bilaterally within normal excretion pattern. In the right lower quadrant there is a persistent abscess cavity identified consistent with the known history of perforated appendicitis. It measures 6.3 by 4.1 cm in greatest dimension. Inflammatory changes course along the tract of the previous catheter to the back and there are some edematous changes in the left flank as well. Scanning into the pelvis demonstrates the bladder to be well distended. No free pelvic fluid is seen. The appendix remains inflamed similar to that seen previously. No acute bony abnormality is noted. IMPRESSION: Persistent right lower quadrant abscess secondary to perforated appendicitis. The appendix remains inflamed. There is inflammatory change along the tract of the previous drainage catheter is well as some inflammatory changes in the subcutaneous fat and left flank. Repeat drainage is likely appropriate. No other focal abnormality is seen. Electronically Signed   By: MInez CatalinaM.D.   On: 11/03/2015 17:21   Ct Image Guided Drainage By Percutaneous Catheter  11/04/2015  INDICATION: Appendicitis, recurrent intra-abdominal abscess EXAM: CT GUIDED DRAINAGE OF RIGHT ABDOMINAL ABSCESS MEDICATIONS: The patient is currently admitted to the hospital and receiving intravenous antibiotics. The antibiotics were administered within an appropriate time frame prior to the initiation of the procedure.  ANESTHESIA/SEDATION: 2.0 mg IV Versed 100 mcg IV Fentanyl Moderate Sedation Time:  12 The patient was continuously monitored during the procedure by the interventional radiology nurse under my direct supervision. COMPLICATIONS: None immediate. TECHNIQUE: Informed written consent was obtained from the patient after a thorough discussion of the procedural risks, benefits and alternatives. All questions were addressed. Maximal Sterile Barrier Technique was utilized including caps, mask, sterile gowns, sterile gloves, sterile drape, hand hygiene and skin antiseptic. A timeout was performed prior to the initiation of the procedure. PROCEDURE: Previous imaging reviewed. Patient positioned right anterior oblique. Noncontrast localization CT performed. The right lower quadrant intra-abdominal abscess was localized. The right lower quadrant was prepped with ChloraPrep in a sterile fashion, and a sterile drape was applied covering the operative field. A sterile gown and sterile gloves were used for the procedure. Local anesthesia was provided with 1% Lidocaine. Under sterile conditions and local anesthesia, an 18 gauge 15 cm access needle was advanced from a anterior oblique approach into the abscess cavity. Needle position confirmed with CT. Guidewire inserted which coiled within the abscess cavity. Tract dilatation performed to advance a 10 FPakistandrain. Retention loop formed in the abscess. 60 cc purulent fluid aspirated. Sample sent for Gram stain and culture. Drain catheter position reconfirmed with CT. Images obtained for documentation. Catheter secured with a Prolene suture and connected to external suction bulb. Sterile dressing applied. No immediate complication. Patient tolerated the drains well. FINDINGS: Imaging confirms CT-guided needle access of the right abdominal recurrent abscess for drain catheter insertion. IMPRESSION: Successful CT-guided right abdominal abscess drain placement. Electronically Signed   By:  MJerilynn Mages  Shick M.D.   On: 11/04/2015 13:28    Medications / Allergies:  Scheduled Meds: . antiseptic oral rinse  7 mL Mouth Rinse BID  . piperacillin-tazobactam (ZOSYN)  IV  3.375  g Intravenous 3 times per day   Continuous Infusions: . sodium chloride 125 mL/hr at 11/05/15 0800   PRN Meds:.HYDROmorphone (DILAUDID) injection, ondansetron **OR** ondansetron (ZOFRAN) IV  Antibiotics: Anti-infectives    Start     Dose/Rate Route Frequency Ordered Stop   11/03/15 2145  piperacillin-tazobactam (ZOSYN) IVPB 3.375 g     3.375 g 12.5 mL/hr over 240 Minutes Intravenous 3 times per day 11/03/15 2133     11/03/15 1800  piperacillin-tazobactam (ZOSYN) IVPB 3.375 g     3.375 g 100 mL/hr over 30 Minutes Intravenous  Once 11/03/15 1753 11/03/15 1839         Assessment/Plan Perforated appendicitis with abscess -s/p IR drain 4/12, change to PO pain meds, mobilize  FEN-advance diet as tolerated  ID-zosyn. Previous cultures yielded group c strep.  Cx 4/12--->GNR, GPR, GPC VTE prophylaxis-SCD, add lovenox Dispo-OP f/u with Dr. Tomie China, [facility] Pager [phone](7A-4:30P) For consults and floor pages call [phone](7A-4:30P)  11/05/2015 10:24 AM

## 2015-11-05 NOTE — Progress Notes (Signed)
Referring Physician(s): Dr Franky Macho CCS  Supervising Physician: Irish Lack  Chief Complaint:  Rt abdominal abscess drain placed 4/12  Subjective:  Feeling better today Less pain Able to ambulate Eating some  Allergies: Review of patient's allergies indicates no known allergies.  Medications: Prior to Admission medications   Medication Sig Start Date End Date Taking? Authorizing Provider  acetaminophen (TYLENOL) 500 MG tablet Take 1,000 mg by mouth every 6 (six) hours as needed for moderate pain.   Yes Historical Provider, MD  oxyCODONE-acetaminophen (PERCOCET) 7.5-325 MG tablet Take 1-2 tablets by mouth every 4 (four) hours as needed. 10/19/15  Yes Franky Macho, MD     Vital Signs: BP 140/90 mmHg  Pulse 93  Temp(Src) 99.1 F (37.3 C) (Oral)  Resp 16  Ht  (1.905 m)  Wt 232 lb 12.9 oz (105.6 kg)  BMI 29.10 kg/m2  SpO2 97%  Physical Exam  Abdominal: Soft. Bowel sounds are normal. There is tenderness.  Skin: Skin is warm and dry.  Site clean and dry Sl tender Output bloody and purulent Over 200 cc yesterday Wbc wnl Cx abundant wbc; Gr+ rods; Gr - rods; Gr + cocci  Nursing note and vitals reviewed.   Imaging: Ct Abdomen Pelvis W Contrast  11/03/2015  CLINICAL DATA:  History of perforated appendicitis with previous drain placement, drain has been removed and patient has continued drainage from wound. EXAM: CT ABDOMEN AND PELVIS WITH CONTRAST TECHNIQUE: Multidetector CT imaging of the abdomen and pelvis was performed using the standard protocol following bolus administration of intravenous contrast. CONTRAST:  ISOVUE-300 IOPAMIDOL (ISOVUE-300) INJECTION 61% COMPARISON:  10/14/2015, 10/13/2015 FINDINGS: Lung bases are free of acute infiltrate or sizable effusion. The liver, gallbladder, spleen, adrenal glands and pancreas are stable in appearance from the prior exam. Kidneys are well visualized bilaterally within normal excretion pattern. In the  right lower quadrant there is a persistent abscess cavity identified consistent with the known history of perforated appendicitis. It measures 6.3 by 4.1 cm in greatest dimension. Inflammatory changes course along the tract of the previous catheter to the back and there are some edematous changes in the left flank as well. Scanning into the pelvis demonstrates the bladder to be well distended. No free pelvic fluid is seen. The appendix remains inflamed similar to that seen previously. No acute bony abnormality is noted. IMPRESSION: Persistent right lower quadrant abscess secondary to perforated appendicitis. The appendix remains inflamed. There is inflammatory change along the tract of the previous drainage catheter is well as some inflammatory changes in the subcutaneous fat and left flank. Repeat drainage is likely appropriate. No other focal abnormality is seen. Electronically Signed   By: Alcide Clever M.D.   On: 11/03/2015 17:21   Ct Image Guided Drainage By Percutaneous Catheter  11/04/2015  INDICATION: Appendicitis, recurrent intra-abdominal abscess EXAM: CT GUIDED DRAINAGE OF RIGHT ABDOMINAL ABSCESS MEDICATIONS: The patient is currently admitted to the hospital and receiving intravenous antibiotics. The antibiotics were administered within an appropriate time frame prior to the initiation of the procedure. ANESTHESIA/SEDATION: 2.0 mg IV Versed 100 mcg IV Fentanyl Moderate Sedation Time:  12 The patient was continuously monitored during the procedure by the interventional radiology nurse under my direct supervision. COMPLICATIONS: None immediate. TECHNIQUE: Informed written consent was obtained from the patient after a thorough discussion of the procedural risks, benefits and alternatives. All questions were addressed. Maximal Sterile Barrier Technique was utilized including caps, mask, sterile gowns, sterile gloves, sterile drape, hand hygiene and skin  antiseptic. A timeout was performed prior to the  initiation of the procedure. PROCEDURE: Previous imaging reviewed. Patient positioned right anterior oblique. Noncontrast localization CT performed. The right lower quadrant intra-abdominal abscess was localized. The right lower quadrant was prepped with ChloraPrep in a sterile fashion, and a sterile drape was applied covering the operative field. A sterile gown and sterile gloves were used for the procedure. Local anesthesia was provided with 1% Lidocaine. Under sterile conditions and local anesthesia, an 18 gauge 15 cm access needle was advanced from a anterior oblique approach into the abscess cavity. Needle position confirmed with CT. Guidewire inserted which coiled within the abscess cavity. Tract dilatation performed to advance a 10 [place] drain. Retention loop formed in the abscess. 60 cc purulent fluid aspirated. Sample sent for Gram stain and culture. Drain catheter position reconfirmed with CT. Images obtained for documentation. Catheter secured with a Prolene suture and connected to external suction bulb. Sterile dressing applied. No immediate complication. Patient tolerated the drains well. FINDINGS: Imaging confirms CT-guided needle access of the right abdominal recurrent abscess for drain catheter insertion. IMPRESSION: Successful CT-guided right abdominal abscess drain placement. Electronically Signed   By: Judie PetitM.  Shick M.D.   On: 11/04/2015 13:28    Labs:  CBC:  Recent Labs  10/17/15 0700 10/18/15 0713 10/19/15 0642 11/03/15 1407  WBC 19.0* 13.1* 9.5 7.5  HGB 12.6* 11.9* 11.8* 12.6*  HCT 36.4* 35.7* 35.0* 37.1*  PLT 360 433* 389 437*    COAGS:  Recent Labs  10/14/15 0513 11/04/15 0652  INR 1.19 1.38  APTT  --  36    BMP:  Recent Labs  10/11/15 0539 10/12/15 0615 10/17/15 0700 11/03/15 1407  NA 136 136 135 136  K 3.8 3.7 3.6 3.8  CL 99* 96* 94* 103  CO2 29 32 28 23  GLUCOSE 161* 150* 127* 117*  BUN 17 12 18 8   CALCIUM 8.1* 8.5* 8.4* 8.3*  CREATININE 1.44* 1.11  1.19 0.95  GFRNONAA 57* >60 >60 >60  GFRAA >60 >60 >60 >60    LIVER FUNCTION TESTS:  Recent Labs  10/09/15 2015 10/10/15 1432 11/03/15 1407  BILITOT 1.1 1.1 0.5  AST 24 17 16   ALT 15* 12* 14*  ALKPHOS 58 50 61  PROT 7.5 6.9 7.5  ALBUMIN 4.4 4.0 3.1*    Assessment and Plan:  abd abscess drain intact Will follow Plan per CCS  Electronically Signed: Cobe Viney A 11/05/2015, 1:40 PM   I spent a total of 15 Minutes at the the patient's bedside AND on the patient's hospital floor or unit, greater than 50% of which was counseling/coordinating care for abd abscess drain

## 2015-11-06 LAB — CBC
HEMATOCRIT: 31.5 % — AB (ref 39.0–52.0)
HEMOGLOBIN: 10.4 g/dL — AB (ref 13.0–17.0)
MCH: 29.7 pg (ref 26.0–34.0)
MCHC: 33 g/dL (ref 30.0–36.0)
MCV: 90 fL (ref 78.0–100.0)
Platelets: 332 10*3/uL (ref 150–400)
RBC: 3.5 MIL/uL — AB (ref 4.22–5.81)
RDW: 12.7 % (ref 11.5–15.5)
WBC: 5.6 10*3/uL (ref 4.0–10.5)

## 2015-11-06 NOTE — Progress Notes (Signed)
Referring Physician(s): Franky Macho  Supervising Physician: Gilmer Mor  Chief Complaint: Recurrent abdominal abscess S/P perc drain by Dr.Shick 11/04/2015  Subjective:  Gabriel [name] is sitting up in the chair.  He states he feels better today after drain placement.  He confirms nurse flushed the drain and changed the dressing.  Allergies: Review of patient's allergies indicates no known allergies.  Medications: Prior to Admission medications   Medication Sig Start Date End Date Taking? Authorizing Provider  acetaminophen (TYLENOL) 500 MG tablet Take 1,000 mg by mouth every 6 (six) hours as needed for moderate pain.   Yes Historical Provider, MD  oxyCODONE-acetaminophen (PERCOCET) 7.5-325 MG tablet Take 1-2 tablets by mouth every 4 (four) hours as needed. 10/19/15  Yes Franky Macho, MD     Vital Signs: BP 145/93 mmHg  Pulse 90  Temp(Src) 99.1 F (37.3 C) (Oral)  Resp 16  Ht  (1.905 m)  Wt 232 lb 12.9 oz (105.6 kg)  BMI 29.10 kg/m2  SpO2 96%  Physical Exam Awake and alert NAD RLQ drain in place ~50 mls purulent drainage in bulb.   Imaging: Ct Abdomen Pelvis W Contrast  11/03/2015  CLINICAL DATA:  History of perforated appendicitis with previous drain placement, drain has been removed and patient has continued drainage from wound. EXAM: CT ABDOMEN AND PELVIS WITH CONTRAST TECHNIQUE: Multidetector CT imaging of the abdomen and pelvis was performed using the standard protocol following bolus administration of intravenous contrast. CONTRAST:  ISOVUE-300 IOPAMIDOL (ISOVUE-300) INJECTION 61% COMPARISON:  10/14/2015, 10/13/2015 FINDINGS: Lung bases are free of acute infiltrate or sizable effusion. The liver, gallbladder, spleen, adrenal glands and pancreas are stable in appearance from the prior exam. Kidneys are well visualized bilaterally within normal excretion pattern. In the right lower quadrant there is a persistent abscess cavity identified consistent with  the known history of perforated appendicitis. It measures 6.3 by 4.1 cm in greatest dimension. Inflammatory changes course along the tract of the previous catheter to the back and there are some edematous changes in the left flank as well. Scanning into the pelvis demonstrates the bladder to be well distended. No free pelvic fluid is seen. The appendix remains inflamed similar to that seen previously. No acute bony abnormality is noted. IMPRESSION: Persistent right lower quadrant abscess secondary to perforated appendicitis. The appendix remains inflamed. There is inflammatory change along the tract of the previous drainage catheter is well as some inflammatory changes in the subcutaneous fat and left flank. Repeat drainage is likely appropriate. No other focal abnormality is seen. Electronically Signed   By: Alcide Clever M.D.   On: 11/03/2015 17:21   Ct Image Guided Drainage By Percutaneous Catheter  11/04/2015  INDICATION: Appendicitis, recurrent intra-abdominal abscess EXAM: CT GUIDED DRAINAGE OF RIGHT ABDOMINAL ABSCESS MEDICATIONS: The patient is currently admitted to the hospital and receiving intravenous antibiotics. The antibiotics were administered within an appropriate time frame prior to the initiation of the procedure. ANESTHESIA/SEDATION: 2.0 mg IV Versed 100 mcg IV Fentanyl Moderate Sedation Time:  12 The patient was continuously monitored during the procedure by the interventional radiology nurse under my direct supervision. COMPLICATIONS: None immediate. TECHNIQUE: Informed written consent was obtained from the patient after a thorough discussion of the procedural risks, benefits and alternatives. All questions were addressed. Maximal Sterile Barrier Technique was utilized including caps, mask, sterile gowns, sterile gloves, sterile drape, hand hygiene and skin antiseptic. A timeout was performed prior to the initiation of the procedure. PROCEDURE: Previous imaging reviewed. Patient positioned  right anterior oblique. Noncontrast localization CT performed. The right lower quadrant intra-abdominal abscess was localized. The right lower quadrant was prepped with ChloraPrep in a sterile fashion, and a sterile drape was applied covering the operative field. A sterile gown and sterile gloves were used for the procedure. Local anesthesia was provided with 1% Lidocaine. Under sterile conditions and local anesthesia, an 18 gauge 15 cm access needle was advanced from a anterior oblique approach into the abscess cavity. Needle position confirmed with CT. Guidewire inserted which coiled within the abscess cavity. Tract dilatation performed to advance a 10 [place] drain. Retention loop formed in the abscess. 60 cc purulent fluid aspirated. Sample sent for Gram stain and culture. Drain catheter position reconfirmed with CT. Images obtained for documentation. Catheter secured with a Prolene suture and connected to external suction bulb. Sterile dressing applied. No immediate complication. Patient tolerated the drains well. FINDINGS: Imaging confirms CT-guided needle access of the right abdominal recurrent abscess for drain catheter insertion. IMPRESSION: Successful CT-guided right abdominal abscess drain placement. Electronically Signed   By: Judie PetitM.  Shick M.D.   On: 11/04/2015 13:28    Labs:  CBC:  Recent Labs  10/18/15 0713 10/19/15 0642 11/03/15 1407 11/06/15 0354  WBC 13.1* 9.5 7.5 5.6  HGB 11.9* 11.8* 12.6* 10.4*  HCT 35.7* 35.0* 37.1* 31.5*  PLT 433* 389 437* 332    COAGS:  Recent Labs  10/14/15 0513 11/04/15 0652  INR 1.19 1.38  APTT  --  36    BMP:  Recent Labs  10/11/15 0539 10/12/15 0615 10/17/15 0700 11/03/15 1407  NA 136 136 135 136  K 3.8 3.7 3.6 3.8  CL 99* 96* 94* 103  CO2 29 32 28 23  GLUCOSE 161* 150* 127* 117*  BUN 17 12 18 8   CALCIUM 8.1* 8.5* 8.4* 8.3*  CREATININE 1.44* 1.11 1.19 0.95  GFRNONAA 57* >60 >60 >60  GFRAA >60 >60 >60 >60    LIVER FUNCTION  TESTS:  Recent Labs  10/09/15 2015 10/10/15 1432 11/03/15 1407  BILITOT 1.1 1.1 0.5  AST 24 17 16   ALT 15* 12* 14*  ALKPHOS 58 50 61  PROT 7.5 6.9 7.5  ALBUMIN 4.4 4.0 3.1*    Assessment and Plan:  Perforted appendix 09/2015  Abd drain placed 3/22 and removed 1 week ago  Continued drainage and pain with recurrent abscess  CT guided perc drain placed by Dr. Miles CostainShick  11/04/2015  Continue current care with flushes and antibiotics.  Consider repeat CT scan next week  Electronically Signed: Gwynneth MacleodWENDY S BLAIR PA-C 11/06/2015, 2:41 PM   I spent a total of 15 Minutes at the the patient's bedside AND on the patient's hospital floor or unit, greater than 50% of which was counseling/coordinating care for perc drain.

## 2015-11-06 NOTE — Progress Notes (Signed)
Subjective: Has mild to moderate right lower quadrant pain Voiding.  Ambulating.  Tolerating diet without nausea. Heart rate 91.  Temp 98.6.  BP 146/90.  SPO2 99% room air. Looks reasonably comfortable.  Cultures still polymicrobial. Remains on Zosyn as monotherapy WBC 5600.  White count has never been a good indicator for him, however   Objective: Vital signs in last 24 hours: Temp:  [97.6 F (36.4 C)-99.6 F (37.6 C)] 98.6 F (37 C) (04/14 0525) Pulse Rate:  [91-103] 91 (04/14 0525) Resp:  [17] 17 (04/14 0525) BP: (135-146)/(81-90) 146/90 mmHg (04/14 0525) SpO2:  [97 %-99 %] 99 % (04/14 0525) Last BM Date: 11/05/15  Intake/Output from previous day: 04/13 0701 - 04/14 0700 In: 5772.1 [P.O.:820; I.V.:4902.1; IV Piggyback:50] Out: 852 [Urine:802; Drains:50] Intake/Output this shift:    General appearance: Looks reasonably comfortable.  Mental status normal. Resp: clear to auscultation bilaterally GI: Right lower quadrant tenderness.  Soft elsewhere.  Drainage purulent.  Lab Results:   Recent Labs  11/03/15 1407 11/06/15 0354  WBC 7.5 5.6  HGB 12.6* 10.4*  HCT 37.1* 31.5*  PLT 437* 332   BMET  Recent Labs  11/03/15 1407  NA 136  K 3.8  CL 103  CO2 23  GLUCOSE 117*  BUN 8  CREATININE 0.95  CALCIUM 8.3*   PT/INR  Recent Labs  11/04/15 0652  LABPROT 17.0*  INR 1.38   ABG No results for input(s): PHART, HCO3 in the last 72 hours.  Invalid input(s): PCO2, PO2  Studies/Results: Ct Image Guided Drainage By Percutaneous Catheter  11/04/2015  INDICATION: Appendicitis, recurrent intra-abdominal abscess EXAM: CT GUIDED DRAINAGE OF RIGHT ABDOMINAL ABSCESS MEDICATIONS: The patient is currently admitted to the hospital and receiving intravenous antibiotics. The antibiotics were administered within an appropriate time frame prior to the initiation of the procedure. ANESTHESIA/SEDATION: 2.0 mg IV Versed 100 mcg IV Fentanyl Moderate Sedation Time:  12 The  patient was continuously monitored during the procedure by the interventional radiology nurse under my direct supervision. COMPLICATIONS: None immediate. TECHNIQUE: Informed written consent was obtained from the patient after a thorough discussion of the procedural risks, benefits and alternatives. All questions were addressed. Maximal Sterile Barrier Technique was utilized including caps, mask, sterile gowns, sterile gloves, sterile drape, hand hygiene and skin antiseptic. A timeout was performed prior to the initiation of the procedure. PROCEDURE: Previous imaging reviewed. Patient positioned right anterior oblique. Noncontrast localization CT performed. The right lower quadrant intra-abdominal abscess was localized. The right lower quadrant was prepped with ChloraPrep in a sterile fashion, and a sterile drape was applied covering the operative field. A sterile gown and sterile gloves were used for the procedure. Local anesthesia was provided with 1% Lidocaine. Under sterile conditions and local anesthesia, an 18 gauge 15 cm access needle was advanced from a anterior oblique approach into the abscess cavity. Needle position confirmed with CT. Guidewire inserted which coiled within the abscess cavity. Tract dilatation performed to advance a 10 JamaicaFrench drain. Retention loop formed in the abscess. 60 cc purulent fluid aspirated. Sample sent for Gram stain and culture. Drain catheter position reconfirmed with CT. Images obtained for documentation. Catheter secured with a Prolene suture and connected to external suction bulb. Sterile dressing applied. No immediate complication. Patient tolerated the drains well. FINDINGS: Imaging confirms CT-guided needle access of the right abdominal recurrent abscess for drain catheter insertion. IMPRESSION: Successful CT-guided right abdominal abscess drain placement. Electronically Signed   By: Judie PetitM.  Shick M.D.   On: 11/04/2015 13:28  Anti-infectives: Anti-infectives    Start      Dose/Rate Route Frequency Ordered Stop   11/03/15 2145  piperacillin-tazobactam (ZOSYN) IVPB 3.375 g     3.375 g 12.5 mL/hr over 240 Minutes Intravenous 3 times per day 11/03/15 2133     11/03/15 1800  piperacillin-tazobactam (ZOSYN) IVPB 3.375 g     3.375 g 100 mL/hr over 30 Minutes Intravenous  Once 11/03/15 1753 11/03/15 1839      Assessment/Plan:  Perforated appendicitis with abscess -s/p IR drain 4/12, change to PO pain meds, mobilize .  Not ready for discharge yet.  Maybe tomorrow.  If cultures are helpful, we can adjust him to oral antibiotics.  I would favor a full 14 days of antibiotics and follow-up in IR drain clinic for repeat CT scan prior to drain removal FEN-advance diet as tolerated  ID-zosyn.  Continue parenteral antibiotics.  Previous cultures yielded group c strep. Cx 4/12--->GNR, GPR, GPC VTE prophylaxis-SCD, add lovenox Dispo-OP f/u with Dr. Lovell Sheehan    LOS: 3 days    Ernestene Mention 11/06/2015

## 2015-11-06 NOTE — Progress Notes (Signed)
Nutrition Follow-up  DOCUMENTATION CODES:   Not applicable  INTERVENTION:   -Continue with soft diet -Encourage adequate PO intake  NUTRITION DIAGNOSIS:   Inadequate oral intake related to inability to eat as evidenced by NPO status.  Resolved  GOAL:   Patient will meet greater than or equal to 90% of their needs  Met  MONITOR:   PO intake, Labs, Weight trends, Skin, I & O's  REASON FOR ASSESSMENT:   Malnutrition Screening Tool    ASSESSMENT:   45 yo male with recent history of hospitalization for perforated appendicitis treated with drain. The drain was empirically removed after resolution of symptoms 1 week ago. Afterwards there was green drainage from the percutaneous site for the next few days and the home health nurse assessed it today and recommended going to the ER for evaluation. He has undergone repeat CT scan showing a 4x7cm abscess in the RLQ. He denies nausea or vomiting, denies fevers or chills, his main complaint is feeling weak for the past 4 days.  Pt using restroom at time of visit.   Pt has been advanced to a soft diet. Meal completion 50%. Spoke with RN, who reports pt with good appetite and estimates he consumed 75-100% of breakfast tray this morning.   Reviewed surgery note from 11/06/15; pt will likely be ready for d/c on 11/07/15. He will be discharge home on antibiotics.   Labs reviewed.   Diet Order:  DIET SOFT Room service appropriate?: Yes; Fluid consistency:: Thin  Skin:  Reviewed, no issues (closed abdominal incision)  Last BM:  11/05/15  Height:   Ht Readings from Last 1 Encounters:  11/03/15 '6\' 3"'$  (1.905 m)    Weight:   Wt Readings from Last 1 Encounters:  11/03/15 232 lb 12.9 oz (105.6 kg)    Ideal Body Weight:  89.1 kg  BMI:  Body mass index is 29.1 kg/(m^2).  Estimated Nutritional Needs:   Kcal:  1900-2100  Protein:  90-105 grams  Fluid:  >1.9 L  EDUCATION NEEDS:   No education needs identified at this  time  Durrell Barajas A. Jimmye Norman, RD, LDN, CDE Pager: [phone] After hours Pager: [phone]

## 2015-11-07 LAB — CULTURE, ROUTINE-ABSCESS

## 2015-11-07 MED ORDER — DIPHENHYDRAMINE HCL 25 MG PO CAPS
25.0000 mg | ORAL_CAPSULE | Freq: Three times a day (TID) | ORAL | Status: DC | PRN
  Administered 2015-11-07 – 2015-11-08 (×2): 25 mg via ORAL
  Filled 2015-11-07 (×2): qty 1

## 2015-11-07 NOTE — Progress Notes (Signed)
Referring Physician(s): [facility] Dr Derrell Lolling  Supervising Physician: Gilmer Mor  Chief Complaint:  RLQ abscess Recuurrent;  perf appx  Subjective:  Drain placed 4/12 Doing better daily Still with significant purulent output  Allergies: Review of patient's allergies indicates no known allergies.  Medications: Prior to Admission medications   Medication Sig Start Date End Date Taking? Authorizing Provider  acetaminophen (TYLENOL) 500 MG tablet Take 1,000 mg by mouth every 6 (six) hours as needed for moderate pain.   Yes Historical Provider, MD  oxyCODONE-acetaminophen (PERCOCET) 7.5-325 MG tablet Take 1-2 tablets by mouth every 4 (four) hours as needed. 10/19/15  Yes Franky Macho, MD     Vital Signs: BP 141/87 mmHg  Pulse 98  Temp(Src) 98 F (36.7 C) (Oral)  Resp 19  Ht  (1.905 m)  Wt 232 lb 12.9 oz (105.6 kg)  BMI 29.10 kg/m2  SpO2 98%  Physical Exam  Constitutional: He is oriented to person, place, and time.  Neurological: He is alert and oriented to person, place, and time.  Skin: Skin is warm and dry.  Site clean and dry NT No bleeding  output 25 cc in JP---purulent 35 cc total yesterday afeb Wbc wnl  Nursing note and vitals reviewed. Cx Strept  Imaging: Ct Abdomen Pelvis W Contrast  11/03/2015  CLINICAL DATA:  History of perforated appendicitis with previous drain placement, drain has been removed and patient has continued drainage from wound. EXAM: CT ABDOMEN AND PELVIS WITH CONTRAST TECHNIQUE: Multidetector CT imaging of the abdomen and pelvis was performed using the standard protocol following bolus administration of intravenous contrast. CONTRAST:  ISOVUE-300 IOPAMIDOL (ISOVUE-300) INJECTION 61% COMPARISON:  10/14/2015, 10/13/2015 FINDINGS: Lung bases are free of acute infiltrate or sizable effusion. The liver, gallbladder, spleen, adrenal glands and pancreas are stable in appearance from the prior exam. Kidneys are well visualized bilaterally  within normal excretion pattern. In the right lower quadrant there is a persistent abscess cavity identified consistent with the known history of perforated appendicitis. It measures 6.3 by 4.1 cm in greatest dimension. Inflammatory changes course along the tract of the previous catheter to the back and there are some edematous changes in the left flank as well. Scanning into the pelvis demonstrates the bladder to be well distended. No free pelvic fluid is seen. The appendix remains inflamed similar to that seen previously. No acute bony abnormality is noted. IMPRESSION: Persistent right lower quadrant abscess secondary to perforated appendicitis. The appendix remains inflamed. There is inflammatory change along the tract of the previous drainage catheter is well as some inflammatory changes in the subcutaneous fat and left flank. Repeat drainage is likely appropriate. No other focal abnormality is seen. Electronically Signed   By: Alcide Clever M.D.   On: 11/03/2015 17:21   Ct Image Guided Drainage By Percutaneous Catheter  11/04/2015  INDICATION: Appendicitis, recurrent intra-abdominal abscess EXAM: CT GUIDED DRAINAGE OF RIGHT ABDOMINAL ABSCESS MEDICATIONS: The patient is currently admitted to the hospital and receiving intravenous antibiotics. The antibiotics were administered within an appropriate time frame prior to the initiation of the procedure. ANESTHESIA/SEDATION: 2.0 mg IV Versed 100 mcg IV Fentanyl Moderate Sedation Time:  12 The patient was continuously monitored during the procedure by the interventional radiology nurse under my direct supervision. COMPLICATIONS: None immediate. TECHNIQUE: Informed written consent was obtained from the patient after a thorough discussion of the procedural risks, benefits and alternatives. All questions were addressed. Maximal Sterile Barrier Technique was utilized including caps, mask, sterile gowns, sterile gloves,  sterile drape, hand hygiene and skin antiseptic. A  timeout was performed prior to the initiation of the procedure. PROCEDURE: Previous imaging reviewed. Patient positioned right anterior oblique. Noncontrast localization CT performed. The right lower quadrant intra-abdominal abscess was localized. The right lower quadrant was prepped with ChloraPrep in a sterile fashion, and a sterile drape was applied covering the operative field. A sterile gown and sterile gloves were used for the procedure. Local anesthesia was provided with 1% Lidocaine. Under sterile conditions and local anesthesia, an 18 gauge 15 cm access needle was advanced from a anterior oblique approach into the abscess cavity. Needle position confirmed with CT. Guidewire inserted which coiled within the abscess cavity. Tract dilatation performed to advance a 10 [place]French drain. Retention loop formed in the abscess. 60 cc purulent fluid aspirated. Sample sent for Gram stain and culture. Drain catheter position reconfirmed with CT. Images obtained for documentation. Catheter secured with a Prolene suture and connected to external suction bulb. Sterile dressing applied. No immediate complication. Patient tolerated the drains well. FINDINGS: Imaging confirms CT-guided needle access of the right abdominal recurrent abscess for drain catheter insertion. IMPRESSION: Successful CT-guided right abdominal abscess drain placement. Electronically Signed   By: Judie PetitM.  Shick M.D.   On: 11/04/2015 13:28    Labs:  CBC:  Recent Labs  10/18/15 0713 10/19/15 0642 11/03/15 1407 11/06/15 0354  WBC 13.1* 9.5 7.5 5.6  HGB 11.9* 11.8* 12.6* 10.4*  HCT 35.7* 35.0* 37.1* 31.5*  PLT 433* 389 437* 332    COAGS:  Recent Labs  10/14/15 0513 11/04/15 0652  INR 1.19 1.38  APTT  --  36    BMP:  Recent Labs  10/11/15 0539 10/12/15 0615 10/17/15 0700 11/03/15 1407  NA 136 136 135 136  K 3.8 3.7 3.6 3.8  CL 99* 96* 94* 103  CO2 29 32 28 23  GLUCOSE 161* 150* 127* 117*  BUN 17 12 18 8   CALCIUM 8.1* 8.5*  8.4* 8.3*  CREATININE 1.44* 1.11 1.19 0.95  GFRNONAA 57* >60 >60 >60  GFRAA >60 >60 >60 >60    LIVER FUNCTION TESTS:  Recent Labs  10/09/15 2015 10/10/15 1432 11/03/15 1407  BILITOT 1.1 1.1 0.5  AST 24 17 16   ALT 15* 12* 14*  ALKPHOS 58 50 61  PROT 7.5 6.9 7.5  ALBUMIN 4.4 4.0 3.1*    Assessment and Plan:  RLQ abscess drain in place Ambulating; eating Better daily Plan per [facility] Pt will hear from OP IR drain clinic for follow up date and time  Electronically Signed: Odeal Welden A 11/07/2015, 12:34 PM   I spent a total of 15 Minutes at the the patient's bedside AND on the patient's hospital floor or unit, greater than 50% of which was counseling/coordinating care for abscess drain

## 2015-11-07 NOTE — Progress Notes (Signed)
  Subjective: Stable and alert.  Mild right lower quadrant pain.  Tolerating diet.  Ambulating.  Voiding okay.  Had a bowel movement.  Cultures polymicrobial.  Group C strep identified.  Also gram-negative rods and gram-positive rods. Afebrile on Zosyn which seems appropriate  Drainage down to 35 mL per 24 hours was still quite purulent.  Objective: Vital signs in last 24 hours: Temp:  [98 F (36.7 C)-99.1 F (37.3 C)] 98 F (36.7 C) (04/15 0407) Pulse Rate:  [90-98] 98 (04/15 0407) Resp:  [16-19] 19 (04/15 0407) BP: (141-146)/(87-100) 141/87 mmHg (04/15 0458) SpO2:  [96 %-98 %] 98 % (04/15 0407) Last BM Date: 11/06/15  Intake/Output from previous day: 04/14 0701 - 04/15 0700 In: 4180 [P.O.:1320; I.V.:2750; IV Piggyback:100] Out: 2510 [Urine:2475; Drains:35] Intake/Output this shift: Total I/O In: -  Out: 675 [Urine:675]  General appearance: Alert.  Cooperative.  No distress.  Mental status normal. Resp: clear to auscultation bilaterally GI: Reasonably soft.  Doesn't seem distended.  Drain dressing clean and dry.  Tender around drain site but maybe somewhat better.  Drainage grossly purulent.  Lab Results:   Recent Labs  11/06/15 0354  WBC 5.6  HGB 10.4*  HCT 31.5*  PLT 332   BMET No results for input(s): NA, K, CL, CO2, GLUCOSE, BUN, CREATININE, CALCIUM in the last 72 hours. PT/INR No results for input(s): LABPROT, INR in the last 72 hours. ABG No results for input(s): PHART, HCO3 in the last 72 hours.  Invalid input(s): PCO2, PO2  Studies/Results: No results found.  Anti-infectives: Anti-infectives    Start     Dose/Rate Route Frequency Ordered Stop   11/03/15 2145  piperacillin-tazobactam (ZOSYN) IVPB 3.375 g     3.375 g 12.5 mL/hr over 240 Minutes Intravenous 3 times per day 11/03/15 2133     11/03/15 1800  piperacillin-tazobactam (ZOSYN) IVPB 3.375 g     3.375 g 100 mL/hr over 30 Minutes Intravenous  Once 11/03/15 1753 11/03/15 1839       Assessment/Plan:   Perforated appendicitis with abscess -s/p IR drain 4/12, change to PO pain meds, mobilize . Not ready for discharge yet.He is fearful of early discharge because of the failure of outpatient management.  We will not discharge today.. Eventually transition to 14 days of oral antibiotics.  Will plan follow-up in IR drain clinic for repeat CT scan prior to drain removal FEN-advance diet as tolerated  ID-zosyn. Continue parenteral antibiotics. Previous cultures yielded group c strep. Cx 4/12--->GNR, GPR, GPC, group C strep. VTE prophylaxis-SCD, add lovenox Dispo-OP f/u surgery.  Patient states he may want to follow[place] with [place] in [place].  We will defer that decision to the patient.   LOS: 4 days    Gabriel [name] M 11/07/2015

## 2015-11-08 MED ORDER — SENNA 8.6 MG PO TABS
2.0000 | ORAL_TABLET | Freq: Every day | ORAL | Status: DC
  Administered 2015-11-08: 17.2 mg via ORAL
  Filled 2015-11-08: qty 2

## 2015-11-08 MED ORDER — POLYETHYLENE GLYCOL 3350 17 G PO PACK
17.0000 g | PACK | Freq: Two times a day (BID) | ORAL | Status: AC
  Administered 2015-11-08 (×2): 17 g via ORAL
  Filled 2015-11-08 (×2): qty 1

## 2015-11-08 NOTE — Progress Notes (Addendum)
  Subjective: Doing okay.  Tolerating diet.  Not having much bowel movement is but is having some. Says he is voiding well..  Says lower abdomen hurts when he sits up.  I tried to explain this was not unexpected.  He is fearful and was to wait until tomorrow to go home.  Afebrile.  Heart rate 84. JP drainage is thinner.  30 mL per 24 hours.  Cultures show group C strep and gram-negative rods, gram-positive rods. Continues on Zosyn  Objective: Vital signs in last 24 hours: Temp:  [98.8 F (37.1 C)-99.1 F (37.3 C)] 99 F (37.2 C) (04/16 0646) Pulse Rate:  [84-88] 84 (04/16 0646) Resp:  [18-19] 18 (04/16 0646) BP: (146-161)/(88-95) 149/88 mmHg (04/16 0646) SpO2:  [98 %-100 %] 99 % (04/16 0646) Weight:  [113.1 kg (249 lb 5.4 oz)] 113.1 kg (249 lb 5.4 oz) (04/15 2100) Last BM Date: 11/06/15  Intake/Output from previous day: 04/15 0701 - 04/16 0700 In: 3226.3 [P.O.:960; I.V.:2256.3] Out: 3405 [Urine:3375; Drains:30] Intake/Output this shift: Total I/O In: 1125.8 [I.V.:1120.8; Other:5] Out: -   General appearance: Alert.  Very talkative.  Family member in room.  No distress.  Sits up and stands up out of bed in the poorly without any difficulty. GI: Soft.  Not distended.  Slight tenderness around drain site.  Drainage is a little cloudy but more watery now  Lab Results:   Recent Labs  11/06/15 0354  WBC 5.6  HGB 10.4*  HCT 31.5*  PLT 332   BMET No results for input(s): NA, K, CL, CO2, GLUCOSE, BUN, CREATININE, CALCIUM in the last 72 hours. PT/INR No results for input(s): LABPROT, INR in the last 72 hours. ABG No results for input(s): PHART, HCO3 in the last 72 hours.  Invalid input(s): PCO2, PO2  Studies/Results: No results found.  Anti-infectives: Anti-infectives    Start     Dose/Rate Route Frequency Ordered Stop   11/03/15 2145  piperacillin-tazobactam (ZOSYN) IVPB 3.375 g     3.375 g 12.5 mL/hr over 240 Minutes Intravenous 3 times per day 11/03/15 2133      11/03/15 1800  piperacillin-tazobactam (ZOSYN) IVPB 3.375 g     3.375 g 100 mL/hr over 30 Minutes Intravenous  Once 11/03/15 1753 11/03/15 1839      Assessment/Plan:  Perforated appendicitis with abscess -s/p IR drain 4/12, change to PO pain meds, mobilize . Not ready for discharge yet.He is fearful of early discharge because of the failure of prior outpatient management. We will not discharge today..  Home tomorrow.   Eventually transition to 14 days of oral antibiotics.  Augmentin should be a good choice for him  Will plan follow-up in IR drain clinic for repeat CT scan prior to drain removal Follow-up with Dr. Sheliah HatchKinsinger.   FEN-advance diet as tolerated MiraLAX and Senokot.   ID-zosyn. Continue parenteral antibiotics. Previous cultures yielded group c strep. Cx 4/12--->GNR, GPR, GPC, group C strep. VTE prophylaxis-SCD, add lovenox Dispo-OP f/u surgery. Wants to follow-up with Dr. Sheliah HatchKinsinger in our office.     LOS: 5 days    Gabriel [name] M 11/08/2015

## 2015-11-09 ENCOUNTER — Other Ambulatory Visit: Payer: Self-pay | Admitting: General Surgery

## 2015-11-09 DIAGNOSIS — K3533 Acute appendicitis with perforation and localized peritonitis, with abscess: Secondary | ICD-10-CM

## 2015-11-09 MED ORDER — OXYCODONE-ACETAMINOPHEN 7.5-325 MG PO TABS: 1.0000 | ORAL_TABLET | Freq: Three times a day (TID) | ORAL | Status: DC | PRN

## 2015-11-09 MED ORDER — SENNA 8.6 MG PO TABS: 2.0000 | ORAL_TABLET | Freq: Every day | ORAL | Status: DC

## 2015-11-09 MED ORDER — AMOXICILLIN-POT CLAVULANATE 875-125 MG PO TABS: 1.0000 | ORAL_TABLET | Freq: Two times a day (BID) | ORAL | Status: DC

## 2015-11-09 MED ORDER — IBUPROFEN 800 MG PO TABS: 800.0000 mg | ORAL_TABLET | Freq: Four times a day (QID) | ORAL | Status: DC | PRN

## 2015-11-09 NOTE — Care Management Note (Signed)
Case Management Note  Patient Details  Name: Gabriel [name] MRN: [id number] Date of Birth: 12/04/1970  Subjective/Objective:                    Action/Plan: MATCH letter given and explained . Patient voiced understanding .   DC address is : [address] , [place] [id number] Phone [phone]   Patient does not have PCP , [facility], patient voiced understanding  Expected Discharge Date:                  Expected Discharge Plan:  Home w Home Health Services  In-House Referral:     Discharge planning Services  CM Consult, [facility], [facility], Medication Assistance  Post Acute Care Choice:  Home Health Choice offered to:  Patient  DME Arranged:    DME Agency:     HH Arranged:  RN HH Agency:  [facility]  Status of Service:  Completed, signed off  Medicare Important Message Given:    Date Medicare IM Given:    Medicare IM give by:    Date Additional Medicare IM Given:    Additional Medicare Important Message give by:     If discussed at Long Length of Stay Meetings, dates discussed:    Additional Comments:  Kingsley PlanWile, Veona Bittman Marie, RN 11/09/2015, 10:11 AM
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
## 2015-11-09 NOTE — Progress Notes (Signed)
Gabriel [name] to be D/C'd  per MD order. Discussed with the patient and all questions fully answered.  VSS, Skin clean, dry and intact without evidence of skin break down, no evidence of skin tears noted.  IV catheter discontinued intact. Site without signs and symptoms of complications. Dressing and pressure applied.  An After Visit Summary was printed and given to the patient. Patient received prescription.  D/c education completed with patient/family including follow up instructions, medication list, d/c activities limitations if indicated, with other d/c instructions as indicated by MD - patient able to verbalize understanding, all questions fully answered.   Taught drain care...  Patient instructed to return to ED, call 911, or call MD for any changes in condition.   Patient to be escorted via WC, and D/C home via private auto.

## 2015-11-11 ENCOUNTER — Encounter (HOSPITAL_COMMUNITY): Payer: Self-pay | Admitting: Emergency Medicine

## 2015-11-11 ENCOUNTER — Emergency Department (HOSPITAL_COMMUNITY)
Admission: EM | Admit: 2015-11-11 | Discharge: 2015-11-12 | Disposition: A | Discharge status: Home / Self Care | Payer: MEDICAID | Attending: Emergency Medicine | Admitting: Emergency Medicine

## 2015-11-11 DIAGNOSIS — R5383 Other fatigue: Secondary | ICD-10-CM | POA: Insufficient documentation

## 2015-11-11 DIAGNOSIS — I10 Essential (primary) hypertension: Secondary | ICD-10-CM

## 2015-11-11 DIAGNOSIS — R5381 Other malaise: Secondary | ICD-10-CM

## 2015-11-11 DIAGNOSIS — R1031 Right lower quadrant pain: Secondary | ICD-10-CM | POA: Insufficient documentation

## 2015-11-11 DIAGNOSIS — R531 Weakness: Secondary | ICD-10-CM | POA: Insufficient documentation

## 2015-11-11 LAB — ANAEROBIC CULTURE
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
## 2015-11-11 NOTE — ED Notes (Signed)
Pt c/o weakness, abd pain and states his blood pressure has been high. Pt states he was just released from [place] earlier this week.

## 2015-11-12 MED ORDER — AMLODIPINE BESYLATE 5 MG PO TABS: 5.0000 mg | ORAL_TABLET | Freq: Every day | ORAL | Status: AC

## 2015-11-12 NOTE — Discharge Instructions (Signed)
Deconditioning Deconditioning refers to the changes in your body that occur during a period of inactivity. Deconditioning results in changes to your heart, lungs, and muscles. These changes decrease your ability to endure activity, resulting in feelings of fatigue and weakness. Deconditioning can occur after only a few days of bed rest or inactivity. The longer the period of inactivity, the more severe your symptoms of deconditioning will be. After longer periods of inactivity, it will also take longer for you to return to your previous level of functioning. Deconditioning can be mild, moderate, or severe:  Mild. Your condition interferes with your ability to perform your usual types of exercise, such as running, biking, or swimming.  Moderate. Your condition interferes with your ability to do normal everyday activities. This may include walking, grocery shopping, or doing chores or lawn work.  Severe. Your condition interferes with your ability to perform minimal activity or normal self-care. CAUSES  Some common reasons for inactivity that may result in deconditioning include:  Illnesses, such as cancer, stroke, heart attack, fibromyalgia, or chronic fatigue syndrome.  Injuries, especially back injuries, broken bones, or injury to ligaments or tendons.  Surgery or a long stay in the hospital for any reason.  Pregnancy, especially with conditions that require long periods of bed rest. RISK FACTORS Anything that results in a period of hospitalization or bed rest will put you at risk of deconditioning. Some other factors that can increase the risk include:  Obesity.  Poor nutrition.  Old age.  Injuries or illnesses that interfere with movement and activity. SIGNS AND SYMPTOMS  Feeling weak.  Feeling tired.  Shortness of breath with minor exertion.  Your heart beating faster than normal. You may or may not notice this without taking your pulse.  Pain or discomfort with  activity.  Decreased strength.  Decreased sense of balance.  Decreased endurance.  Difficulty participating in usual forms of exercise.  Difficulty doing activities of daily living, such as grocery shopping or chores.  Difficulty walking around the house and doing basic self-care, such as getting to the bathroom, preparing meals, or doing laundry. DIAGNOSIS  There is no specific test to diagnose deconditioning. Your health care provider will take your medical history and do a physical exam. During the physical exam, the health care provider will check for signs of deconditioning, such as:  Decreased size of muscles.  Decreased strength.  Difficulty with balance.  Shortness of breath or abnormally increased heart rate after minor exertion. TREATMENT  Treatment usually involves a structured exercise program in which activity is increased gradually. Your health care provider will determine which exercises are right for you. The exercise program will likely include aerobic exercise and strength training. Aerobic exercise helps improve the functioning of the heart and lungs as well as the muscles. Strength training helps improve muscle size and strength. Both of these types of exercise will improve your endurance. You may be referred to a physical therapist who can create a safe strengthening program for you to follow. HOME CARE INSTRUCTIONS  Follow the exercise program recommended by your health care provider or physical therapist.  Do not increase your exercise any faster than directed.  Eat a healthy diet.  If your health care provider thinks that you need to lose weight, consider seeing a dietitian to help you do so in a healthy way.  Do not use any tobacco products, including cigarettes, chewing tobacco, or electronic cigarettes. If you need help quitting, ask your health care provider.  Take  medicines only as directed by your health care provider.  Keep all follow-up visits as  directed by your health care provider. This is important. SEEK MEDICAL CARE IF:  You are not able to carry out the prescribed exercise program.  You are not able to carry out your usual level of activity.  You are having trouble doing normal household chores or caring for yourself.  You are becoming increasingly fatigued and weak.  You become light-headed when rising to a sitting or standing position.  Your level of endurance decreases after having improved. SEEK IMMEDIATE MEDICAL CARE IF:  You have chest pain.  You are very short of breath.  You have any episodes of passing out.   This information is not intended to replace advice given to you by your health care provider. Make sure you discuss any questions you have with your health care provider.   Document Released: 11/25/2013 Document Reviewed: 11/25/2013 Elsevier Interactive Patient Education 2016 [employer].  Hypertension Hypertension, commonly called high blood pressure, is when the force of blood pumping through your arteries is too strong. Your arteries are the blood vessels that carry blood from your heart throughout your body. A blood pressure reading consists of a higher number over a lower number, such as 110/72. The higher number (systolic) is the pressure inside your arteries when your heart pumps. The lower number (diastolic) is the pressure inside your arteries when your heart relaxes. Ideally you want your blood pressure below 120/80. Hypertension forces your heart to work harder to pump blood. Your arteries may become narrow or stiff. Having untreated or uncontrolled hypertension can cause heart attack, stroke, kidney disease, and other problems. RISK FACTORS Some risk factors for high blood pressure are controllable. Others are not.  Risk factors you cannot control include:   Race. You may be at higher risk if you are African American.  Age. Risk increases with age.  Gender. Men are at higher risk than  women before age 31 years. After age 18, women are at higher risk than men. Risk factors you can control include:  Not getting enough exercise or physical activity.  Being overweight.  Getting too much fat, sugar, calories, or salt in your diet.  Drinking too much alcohol. SIGNS AND SYMPTOMS Hypertension does not usually cause signs or symptoms. Extremely high blood pressure (hypertensive crisis) may cause headache, anxiety, shortness of breath, and nosebleed. DIAGNOSIS To check if you have hypertension, your health care provider will measure your blood pressure while you are seated, with your arm held at the level of your heart. It should be measured at least twice using the same arm. Certain conditions can cause a difference in blood pressure between your right and left arms. A blood pressure reading that is higher than normal on one occasion does not mean that you need treatment. If it is not clear whether you have high blood pressure, you may be asked to return on a different day to have your blood pressure checked again. Or, you may be asked to monitor your blood pressure at home for 1 or more weeks. TREATMENT Treating high blood pressure includes making lifestyle changes and possibly taking medicine. Living a healthy lifestyle can help lower high blood pressure. You may need to change some of your habits. Lifestyle changes may include:  Following the DASH diet. This diet is high in fruits, vegetables, and whole grains. It is low in salt, red meat, and added sugars.  Keep your sodium intake below 2,300  mg per day.  Getting at least 30-45 minutes of aerobic exercise at least 4 times per week.  Losing weight if necessary.  Not smoking.  Limiting alcoholic beverages.  Learning ways to reduce stress. Your health care provider may prescribe medicine if lifestyle changes are not enough to get your blood pressure under control, and if one of the following is true:  You are 18-59 years  of age and your systolic blood pressure is above 140.  You are 107 years of age or older, and your systolic blood pressure is above 150.  Your diastolic blood pressure is above 90.  You have diabetes, and your systolic blood pressure is over 140 or your diastolic blood pressure is over 90.  You have kidney disease and your blood pressure is above 140/90.  You have heart disease and your blood pressure is above 140/90. Your personal target blood pressure may vary depending on your medical conditions, your age, and other factors. HOME CARE INSTRUCTIONS  Have your blood pressure rechecked as directed by your health care provider.   Take medicines only as directed by your health care provider. Follow the directions carefully. Blood pressure medicines must be taken as prescribed. The medicine does not work as well when you skip doses. Skipping doses also puts you at risk for problems.  Do not smoke.   Monitor your blood pressure at home as directed by your health care provider. SEEK MEDICAL CARE IF:   You think you are having a reaction to medicines taken.  You have recurrent headaches or feel dizzy.  You have swelling in your ankles.  You have trouble with your vision. SEEK IMMEDIATE MEDICAL CARE IF:  You develop a severe headache or confusion.  You have unusual weakness, numbness, or feel faint.  You have severe chest or abdominal pain.  You vomit repeatedly.  You have trouble breathing. MAKE SURE YOU:   Understand these instructions.  Will watch your condition.  Will get help right away if you are not doing well or get worse.   This information is not intended to replace advice given to you by your health care provider. Make sure you discuss any questions you have with your health care provider.   Document Released: 07/11/2005 Document Revised: 11/25/2014 Document Reviewed: 05/03/2013 Elsevier Interactive Patient Education 2016 [employer].  How to Take Your  Blood Pressure HOW DO I GET A BLOOD PRESSURE MACHINE?  You can buy an electronic home blood pressure machine at your local pharmacy. Insurance will sometimes cover the cost if you have a prescription.  Ask your doctor what type of machine is best for you. There are different machines for your arm and your wrist.  If you decide to buy a machine to check your blood pressure on your arm, first check the size of your arm so you can buy the right size cuff. To check the size of your arm:   Use a measuring tape that shows both inches and centimeters.   Wrap the measuring tape around the upper-middle part of your arm. You may need someone to help you measure.   Write down your arm measurement in both inches and centimeters.   To measure your blood pressure correctly, it is important to have the right size cuff.   If your arm is up to 13 inches (up to 34 centimeters), get an adult cuff size.  If your arm is 13 to 17 inches (35 to 44 centimeters), get a large adult cuff size.  If your arm is 17 to 20 inches (45 to 52 centimeters), get an adult thigh cuff.  WHAT DO THE NUMBERS MEAN?   There are two numbers that make up your blood pressure. For example: 120/80.  The first number (120 in our example) is called the "systolic pressure." It is a measure of the pressure in your blood vessels when your heart is pumping blood.  The second number (80 in our example) is called the "diastolic pressure." It is a measure of the pressure in your blood vessels when your heart is resting between beats.  Your doctor will tell you what your blood pressure should be. WHAT SHOULD I DO BEFORE I CHECK MY BLOOD PRESSURE?   Try to rest or relax for at least 30 minutes before you check your blood pressure.  Do not smoke.  Do not have any drinks with caffeine, such as:  Soda.  Coffee.  Tea.  Check your blood pressure in a quiet room.  Sit down and stretch out your arm on a table. Keep your arm at  about the level of your heart. Let your arm relax.  Make sure that your legs are not crossed. HOW DO I CHECK MY BLOOD PRESSURE?  Follow the directions that came with your machine.  Make sure you remove any tight-fitting clothing from your arm or wrist. Wrap the cuff around your upper arm or wrist. You should be able to fit a finger between the cuff and your arm. If you cannot fit a finger between the cuff and your arm, it is too tight and should be removed and rewrapped.  Some units require you to manually pump up the arm cuff.  Automatic units inflate the cuff when you press a button.  Cuff deflation is automatic in both models.  After the cuff is inflated, the unit measures your blood pressure and pulse. The readings are shown on a monitor. Hold still and breathe normally while the cuff is inflated.  Getting a reading takes less than a minute.  Some models store readings in a memory. Some provide a printout of readings. If your machine does not store your readings, keep a written record.  Take readings with you to your next visit with your doctor.   This information is not intended to replace advice given to you by your health care provider. Make sure you discuss any questions you have with your health care provider.   Document Released: 06/23/2008 Document Revised: 08/01/2014 Document Reviewed: 09/05/2013 Elsevier Interactive Patient Education [employer]2016 [employer] Inc.

## 2015-11-18 ENCOUNTER — Other Ambulatory Visit: Payer: Self-pay | Admitting: General Surgery

## 2015-11-18 ENCOUNTER — Ambulatory Visit
Admission: RE | Admit: 2015-11-18 | Discharge: 2015-11-18 | Disposition: A | Discharge status: Home / Self Care | Payer: No Typology Code available for payment source | Source: Ambulatory Visit | Attending: General Surgery | Admitting: General Surgery

## 2015-11-18 ENCOUNTER — Ambulatory Visit
Admission: RE | Admit: 2015-11-18 | Discharge: 2015-11-18 | Disposition: A | Discharge status: Home / Self Care | Payer: No Typology Code available for payment source | Source: Ambulatory Visit | Attending: Radiology | Admitting: Radiology

## 2015-11-18 DIAGNOSIS — K3533 Acute appendicitis with perforation and localized peritonitis, with abscess: Secondary | ICD-10-CM

## 2015-11-18 MED ORDER — IOPAMIDOL (ISOVUE-300) INJECTION 61%
125.0000 mL | Freq: Once | INTRAVENOUS | Status: AC | PRN
  Administered 2015-11-18: 125 mL via INTRAVENOUS
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
## 2015-12-02 ENCOUNTER — Ambulatory Visit
Admission: RE | Admit: 2015-12-02 | Discharge: 2015-12-02 | Disposition: A | Discharge status: Home / Self Care | Payer: No Typology Code available for payment source | Source: Ambulatory Visit | Attending: General Surgery | Admitting: General Surgery

## 2015-12-02 DIAGNOSIS — K3533 Acute appendicitis with perforation and localized peritonitis, with abscess: Secondary | ICD-10-CM

## 2015-12-02 HISTORY — PX: IR GENERIC HISTORICAL: IMG1180011
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
## 2015-12-18 ENCOUNTER — Ambulatory Visit: Payer: Self-pay | Admitting: General Surgery

## 2016-04-06 ENCOUNTER — Encounter (HOSPITAL_COMMUNITY): Payer: Self-pay

## 2016-04-06 ENCOUNTER — Emergency Department (HOSPITAL_COMMUNITY)
Admission: EM | Admit: 2016-04-06 | Discharge: 2016-04-06 | Disposition: A | Discharge status: Home / Self Care | Payer: Self-pay | Attending: General Surgery | Admitting: General Surgery

## 2016-04-06 ENCOUNTER — Emergency Department (HOSPITAL_COMMUNITY): Payer: Self-pay

## 2016-04-06 DIAGNOSIS — K3580 Unspecified acute appendicitis: Secondary | ICD-10-CM | POA: Insufficient documentation

## 2016-04-06 DIAGNOSIS — K37 Unspecified appendicitis: Secondary | ICD-10-CM

## 2016-04-06 DIAGNOSIS — Z79899 Other long term (current) drug therapy: Secondary | ICD-10-CM | POA: Insufficient documentation

## 2016-04-06 LAB — COMPREHENSIVE METABOLIC PANEL
ALBUMIN: 4.4 g/dL (ref 3.5–5.0)
ALK PHOS: 58 U/L (ref 38–126)
ALT: 13 U/L — AB (ref 17–63)
AST: 18 U/L (ref 15–41)
Anion gap: 10 (ref 5–15)
BUN: 11 mg/dL (ref 6–20)
CALCIUM: 9 mg/dL (ref 8.9–10.3)
CO2: 25 mmol/L (ref 22–32)
CREATININE: 1.14 mg/dL (ref 0.61–1.24)
Chloride: 101 mmol/L (ref 101–111)
GFR calc non Af Amer: >60 mL/min (ref >60)
GLUCOSE: 87 mg/dL (ref 65–99)
Potassium: 3.8 mmol/L (ref 3.5–5.1)
SODIUM: 136 mmol/L (ref 135–145)
Total Bilirubin: 0.8 mg/dL (ref 0.3–1.2)
Total Protein: 7.6 g/dL (ref 6.5–8.1)

## 2016-04-06 LAB — CBC WITH DIFFERENTIAL/PLATELET
Basophils Absolute: 0 10*3/uL (ref 0.0–0.1)
Basophils Relative: 0 %
EOS ABS: 0.2 10*3/uL (ref 0.0–0.7)
Eosinophils Relative: 3 %
HCT: 43.4 % (ref 39.0–52.0)
HEMOGLOBIN: 15.1 g/dL (ref 13.0–17.0)
LYMPHS ABS: 1.5 10*3/uL (ref 0.7–4.0)
LYMPHS PCT: 20 %
MCH: 31.7 pg (ref 26.0–34.0)
MCHC: 34.8 g/dL (ref 30.0–36.0)
MCV: 91 fL (ref 78.0–100.0)
Monocytes Absolute: 0.5 10*3/uL (ref 0.1–1.0)
Monocytes Relative: 6 %
NEUTROS PCT: 71 %
Neutro Abs: 5.3 10*3/uL (ref 1.7–7.7)
Platelets: 225 10*3/uL (ref 150–400)
RBC: 4.77 MIL/uL (ref 4.22–5.81)
RDW: 13.3 % (ref 11.5–15.5)
WBC: 7.5 10*3/uL (ref 4.0–10.5)

## 2016-04-06 LAB — URINALYSIS, ROUTINE W REFLEX MICROSCOPIC
BILIRUBIN URINE: NEGATIVE
GLUCOSE, UA: NEGATIVE mg/dL
HGB URINE DIPSTICK: NEGATIVE
Leukocytes, UA: NEGATIVE
Nitrite: NEGATIVE
PH: 6 (ref 5.0–8.0)
Protein, ur: NEGATIVE mg/dL
Specific Gravity, Urine: 1.02 (ref 1.005–1.030)

## 2016-04-06 LAB — LIPASE, BLOOD: Lipase: 26 U/L (ref 11–51)

## 2016-04-06 MED ORDER — PIPERACILLIN-TAZOBACTAM 3.375 G IVPB 30 MIN
3.3750 g | Freq: Once | INTRAVENOUS | Status: AC
  Administered 2016-04-06: 3.375 g via INTRAVENOUS
  Filled 2016-04-06: qty 50

## 2016-04-06 MED ORDER — AMOXICILLIN-POT CLAVULANATE 875-125 MG PO TABS: 1.0000 | ORAL_TABLET | Freq: Two times a day (BID) | ORAL | 0 refills | Status: DC

## 2016-04-06 MED ORDER — IOPAMIDOL (ISOVUE-300) INJECTION 61%
INTRAVENOUS | Status: AC
  Filled 2016-04-06: qty 30

## 2016-04-06 MED ORDER — IOPAMIDOL (ISOVUE-300) INJECTION 61%
100.0000 mL | Freq: Once | INTRAVENOUS | Status: AC | PRN
  Administered 2016-04-06: 100 mL via INTRAVENOUS

## 2016-04-06 MED ORDER — SODIUM CHLORIDE 0.9 % IV BOLUS (SEPSIS)
1000.0000 mL | Freq: Once | INTRAVENOUS | Status: AC
  Administered 2016-04-06: 1000 mL via INTRAVENOUS

## 2016-04-06 MED ORDER — MORPHINE SULFATE (PF) 4 MG/ML IV SOLN
4.0000 mg | Freq: Once | INTRAVENOUS | Status: AC
  Administered 2016-04-06: 4 mg via INTRAVENOUS
  Filled 2016-04-06: qty 1

## 2016-04-06 MED ORDER — OXYCODONE-ACETAMINOPHEN 5-325 MG PO TABS: 1.0000 | ORAL_TABLET | ORAL | 0 refills | Status: DC | PRN

## 2016-04-06 MED ORDER — ONDANSETRON HCL 4 MG PO TABS: 4.0000 mg | ORAL_TABLET | Freq: Four times a day (QID) | ORAL | 0 refills | Status: AC
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
## 2016-04-06 NOTE — Discharge Instructions (Signed)
Prescriptions for antibiotic, pain medicine, nausea medicine. No restrictions on eating. Follow-up with general surgeon Dr. Franky MachoMark Jenkins.

## 2016-04-06 NOTE — ED Triage Notes (Signed)
Pt reports his appendix ruptured in march and he had a drain put in but did not have surgery to remove appendix.  Pt says had diarrhea 2 days ago and started having pain and tightness in RLQ.  Denies any more diarrhea or n/v.
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
## 2016-04-13 ENCOUNTER — Ambulatory Visit: Payer: Self-pay | Admitting: General Surgery

## 2016-04-13 NOTE — Patient Instructions (Addendum)
Gabriel [name]  04/13/2016   Your procedure is scheduled on: 04-19-16  [facility]  [facility] 3rd floor to  Short Stay Center at  0800 AM.  Call this number if you have problems the morning of surgery [phone]   Remember: ONLY 1 PERSON MAY GO WITH YOU TO SHORT STAY TO GET  READY MORNING OF YOUR SURGERY.  Do not eat food or drink liquids :After Midnight.     Take these medicines the morning of surgery with A SIP OF WATER: Antibiotics-if needed. DO NOT TAKE ANY DIABETIC MEDICATIONS DAY OF YOUR SURGERY                               You may not have any metal on your body including hair pins and              piercings  Do not wear jewelry, make-up, lotions, powders or perfumes, deodorant             Do not wear nail polish.  Do not shave  48 hours prior to surgery.              Men may shave face and neck.   Do not bring valuables to the hospital. [facility] IS NOT             RESPONSIBLE   FOR VALUABLES.  Contacts, dentures or bridgework may not be worn into surgery.  Leave suitcase in the car. After surgery it may be brought to your room.     Patients discharged the day of surgery will not be allowed to drive home.  Name and phone number of your driver:Gabriel [name], [phone] cell  Special Instructions: N/A              Please read over the following fact sheets you were given: _____________________________________________________________________             [facility] - Preparing for [facility] Before surgery, you can play an important role.  Because skin is not sterile, your skin needs to be as free of germs as possible.  You can reduce the number of germs on your skin by washing with CHG (chlorahexidine gluconate) soap before surgery.  CHG is an antiseptic cleaner which kills germs and bonds with the skin to continue killing germs even after washing. Please DO NOT use if you have an allergy to CHG or  antibacterial soaps.  If your skin becomes reddened/irritated stop using the CHG and inform your nurse when you arrive at Short Stay. Do not shave (including legs and underarms) for at least 48 hours prior to the first CHG shower.  You may shave your face/neck. Please follow these instructions carefully:  1.  Shower with CHG Soap the night before surgery and the  morning of Surgery.  2.  If you choose to wash your hair, wash your hair first as usual with your  normal  shampoo.  3.  After you shampoo, rinse your hair and body thoroughly to remove the  shampoo.                           4.  Use CHG as you would any other liquid soap.  You can apply chg directly  to the skin  and wash                       Gently with a scrungie or clean washcloth.  5.  Apply the CHG Soap to your body ONLY FROM THE NECK DOWN.   Do not use on face/ open                           Wound or open sores. Avoid contact with eyes, ears mouth and genitals (private parts).                       Wash face,  Genitals (private parts) with your normal soap.             6.  Wash thoroughly, paying special attention to the area where your surgery  will be performed.  7.  Thoroughly rinse your body with warm water from the neck down.  8.  DO NOT shower/wash with your normal soap after using and rinsing off  the CHG Soap.                9.  Pat yourself dry with a clean towel.            10.  Wear clean pajamas.            11.  Place clean sheets on your bed the night of your first shower and do not  sleep with pets. Day of Surgery : Do not apply any lotions/deodorants the morning of surgery.  Please wear clean clothes to the hospital/surgery center.  FAILURE TO FOLLOW THESE INSTRUCTIONS MAY RESULT IN THE CANCELLATION OF YOUR SURGERY PATIENT SIGNATURE_________________________________  NURSE SIGNATURE__________________________________  ________________________________________________________________________

## 2016-04-15 ENCOUNTER — Encounter (HOSPITAL_COMMUNITY): Payer: Self-pay

## 2016-04-15 ENCOUNTER — Encounter (HOSPITAL_COMMUNITY)
Admission: RE | Admit: 2016-04-15 | Discharge: 2016-04-15 | Disposition: A | Discharge status: Home / Self Care | Payer: No Typology Code available for payment source | Source: Ambulatory Visit | Attending: General Surgery | Admitting: General Surgery

## 2016-04-15 DIAGNOSIS — K36 Other appendicitis: Secondary | ICD-10-CM | POA: Insufficient documentation

## 2016-04-15 DIAGNOSIS — Z01818 Encounter for other preprocedural examination: Secondary | ICD-10-CM | POA: Insufficient documentation

## 2016-04-15 NOTE — Pre-Procedure Instructions (Addendum)
EKG/ 4'17, CXR 3'17, . Labs-CBC/d, CMP,UA  04-06-16 Epic.

## 2016-04-19 ENCOUNTER — Encounter (HOSPITAL_COMMUNITY)
Admission: RE | Disposition: A | Discharge status: Home / Self Care | Payer: Self-pay | Source: Ambulatory Visit | Attending: General Surgery

## 2016-04-19 ENCOUNTER — Ambulatory Visit (HOSPITAL_COMMUNITY): Payer: Self-pay | Admitting: Certified Registered Nurse Anesthetist

## 2016-04-19 ENCOUNTER — Ambulatory Visit (HOSPITAL_COMMUNITY)
Admission: RE | Admit: 2016-04-19 | Discharge: 2016-04-19 | Disposition: A | Discharge status: Home / Self Care | Payer: Self-pay | Source: Ambulatory Visit | Attending: General Surgery | Admitting: General Surgery

## 2016-04-19 ENCOUNTER — Encounter (HOSPITAL_COMMUNITY): Payer: Self-pay | Admitting: *Deleted

## 2016-04-19 DIAGNOSIS — Z6831 Body mass index (BMI) 31.0-31.9, adult: Secondary | ICD-10-CM | POA: Insufficient documentation

## 2016-04-19 DIAGNOSIS — Z79899 Other long term (current) drug therapy: Secondary | ICD-10-CM | POA: Insufficient documentation

## 2016-04-19 DIAGNOSIS — K36 Other appendicitis: Secondary | ICD-10-CM | POA: Insufficient documentation

## 2016-04-19 DIAGNOSIS — E669 Obesity, unspecified: Secondary | ICD-10-CM | POA: Insufficient documentation

## 2016-04-19 HISTORY — PX: LAPAROSCOPIC APPENDECTOMY: SHX408

## 2016-04-19 SURGERY — APPENDECTOMY, LAPAROSCOPIC
Anesthesia: General | Site: Abdomen

## 2016-04-19 MED ORDER — KETOROLAC TROMETHAMINE 30 MG/ML IJ SOLN
INTRAMUSCULAR | Status: AC
  Filled 2016-04-19: qty 1

## 2016-04-19 MED ORDER — KETAMINE HCL 10 MG/ML IJ SOLN
INTRAMUSCULAR | Status: DC | PRN
  Administered 2016-04-19: 20 mg via INTRAVENOUS

## 2016-04-19 MED ORDER — CHLORHEXIDINE GLUCONATE CLOTH 2 % EX PADS: 6.0000 | MEDICATED_PAD | Freq: Once | CUTANEOUS | Status: DC

## 2016-04-19 MED ORDER — ROCURONIUM BROMIDE 10 MG/ML (PF) SYRINGE
PREFILLED_SYRINGE | INTRAVENOUS | Status: DC | PRN
  Administered 2016-04-19 (×2): 10 mg via INTRAVENOUS
  Administered 2016-04-19: 50 mg via INTRAVENOUS

## 2016-04-19 MED ORDER — LACTATED RINGERS IR SOLN
Status: DC | PRN
  Administered 2016-04-19: 1000 mL

## 2016-04-19 MED ORDER — CEFOTETAN DISODIUM-DEXTROSE 2-2.08 GM-% IV SOLR
INTRAVENOUS | Status: AC
  Filled 2016-04-19: qty 50

## 2016-04-19 MED ORDER — FENTANYL CITRATE (PF) 100 MCG/2ML IJ SOLN
INTRAMUSCULAR | Status: DC | PRN
  Administered 2016-04-19: 50 ug via INTRAVENOUS
  Administered 2016-04-19 (×2): 25 ug via INTRAVENOUS
  Administered 2016-04-19 (×2): 50 ug via INTRAVENOUS

## 2016-04-19 MED ORDER — LACTATED RINGERS IV SOLN
INTRAVENOUS | Status: DC | PRN
  Administered 2016-04-19 (×2): via INTRAVENOUS

## 2016-04-19 MED ORDER — LIDOCAINE 2% (20 MG/ML) 5 ML SYRINGE
INTRAMUSCULAR | Status: AC
  Filled 2016-04-19: qty 5

## 2016-04-19 MED ORDER — PROPOFOL 10 MG/ML IV BOLUS
INTRAVENOUS | Status: AC
  Filled 2016-04-19: qty 20

## 2016-04-19 MED ORDER — SUGAMMADEX SODIUM 200 MG/2ML IV SOLN
INTRAVENOUS | Status: DC | PRN
  Administered 2016-04-19: 500 mg via INTRAVENOUS

## 2016-04-19 MED ORDER — LIDOCAINE 2% (20 MG/ML) 5 ML SYRINGE
INTRAMUSCULAR | Status: DC | PRN
Start: 2016-04-19 — End: 2016-04-19
  Administered 2016-04-19: 100 mg via INTRAVENOUS

## 2016-04-19 MED ORDER — ROCURONIUM BROMIDE 10 MG/ML (PF) SYRINGE
PREFILLED_SYRINGE | INTRAVENOUS | Status: AC
  Filled 2016-04-19: qty 10

## 2016-04-19 MED ORDER — KETOROLAC TROMETHAMINE 30 MG/ML IJ SOLN
INTRAMUSCULAR | Status: DC | PRN
  Administered 2016-04-19: 30 mg via INTRAVENOUS

## 2016-04-19 MED ORDER — BUPIVACAINE-EPINEPHRINE 0.5% -1:200000 IJ SOLN
INTRAMUSCULAR | Status: DC | PRN
  Administered 2016-04-19: 40 mL

## 2016-04-19 MED ORDER — HYDROMORPHONE HCL 1 MG/ML IJ SOLN: 0.2500 mg | INTRAMUSCULAR | Status: DC | PRN

## 2016-04-19 MED ORDER — ONDANSETRON HCL 4 MG/2ML IJ SOLN
INTRAMUSCULAR | Status: AC
  Filled 2016-04-19: qty 2

## 2016-04-19 MED ORDER — CEFOTETAN DISODIUM-DEXTROSE 2-2.08 GM-% IV SOLR
2.0000 g | INTRAVENOUS | Status: AC
  Administered 2016-04-19: 2 g via INTRAVENOUS

## 2016-04-19 MED ORDER — 0.9 % SODIUM CHLORIDE (POUR BTL) OPTIME
TOPICAL | Status: DC | PRN
  Administered 2016-04-19: 1000 mL

## 2016-04-19 MED ORDER — MIDAZOLAM HCL 2 MG/2ML IJ SOLN
INTRAMUSCULAR | Status: AC
  Filled 2016-04-19: qty 2

## 2016-04-19 MED ORDER — DEXAMETHASONE SODIUM PHOSPHATE 10 MG/ML IJ SOLN
INTRAMUSCULAR | Status: AC
  Filled 2016-04-19: qty 1

## 2016-04-19 MED ORDER — PROMETHAZINE HCL 25 MG/ML IJ SOLN: 6.2500 mg | INTRAMUSCULAR | Status: DC | PRN

## 2016-04-19 MED ORDER — MIDAZOLAM HCL 5 MG/5ML IJ SOLN
INTRAMUSCULAR | Status: DC | PRN
  Administered 2016-04-19: 2 mg via INTRAVENOUS

## 2016-04-19 MED ORDER — SUGAMMADEX SODIUM 500 MG/5ML IV SOLN
INTRAVENOUS | Status: AC
  Filled 2016-04-19: qty 5

## 2016-04-19 MED ORDER — PROPOFOL 10 MG/ML IV BOLUS
INTRAVENOUS | Status: DC | PRN
  Administered 2016-04-19: 200 mg via INTRAVENOUS

## 2016-04-19 MED ORDER — HYDROCODONE-ACETAMINOPHEN 5-325 MG PO TABS: 1.0000 | ORAL_TABLET | Freq: Four times a day (QID) | ORAL | 0 refills | Status: DC | PRN

## 2016-04-19 MED ORDER — DEXAMETHASONE SODIUM PHOSPHATE 4 MG/ML IJ SOLN
INTRAMUSCULAR | Status: DC | PRN
  Administered 2016-04-19: 10 mg via INTRAVENOUS

## 2016-04-19 MED ORDER — BUPIVACAINE-EPINEPHRINE 0.5% -1:200000 IJ SOLN
INTRAMUSCULAR | Status: AC
  Filled 2016-04-19: qty 1

## 2016-04-19 MED ORDER — GLYCOPYRROLATE 0.2 MG/ML IV SOSY
PREFILLED_SYRINGE | INTRAVENOUS | Status: AC
  Filled 2016-04-19: qty 3

## 2016-04-19 MED ORDER — IBUPROFEN 800 MG PO TABS
800.0000 mg | ORAL_TABLET | Freq: Three times a day (TID) | ORAL | 0 refills | Status: AC | PRN
Start: 2016-04-19 — End: ?

## 2016-04-19 MED ORDER — GLYCOPYRROLATE 0.2 MG/ML IJ SOLN
INTRAMUSCULAR | Status: DC | PRN
  Administered 2016-04-19 (×2): 0.2 mg via INTRAVENOUS

## 2016-04-19 MED ORDER — FENTANYL CITRATE (PF) 100 MCG/2ML IJ SOLN
INTRAMUSCULAR | Status: AC
  Filled 2016-04-19: qty 4

## 2016-04-19 SURGICAL SUPPLY — 49 items
APL SKNCLS STERI-STRIP NONHPOA (GAUZE/BANDAGES/DRESSINGS) ×1
APPLIER CLIP 5 13 M/L LIGAMAX5 (MISCELLANEOUS)
APPLIER CLIP ROT 10 11.4 M/L (STAPLE)
APR CLP MED LRG 11.4X10 (STAPLE)
APR CLP MED LRG 5 ANG JAW (MISCELLANEOUS)
BAG SPEC RTRVL 10 TROC 200 (ENDOMECHANICALS)
BENZOIN TINCTURE PRP APPL 2/3 (GAUZE/BANDAGES/DRESSINGS) ×3 IMPLANT
CABLE HIGH FREQUENCY MONO STRZ (ELECTRODE) ×3 IMPLANT
CLIP APPLIE 5 13 M/L LIGAMAX5 (MISCELLANEOUS) IMPLANT
CLIP APPLIE ROT 10 11.4 M/L (STAPLE) IMPLANT
CLOSURE STERI-STRIP 1/4X4 (GAUZE/BANDAGES/DRESSINGS) ×3 IMPLANT
COVER SURGICAL LIGHT HANDLE (MISCELLANEOUS) ×2 IMPLANT
CUTTER FLEX LINEAR 45M (STAPLE) IMPLANT
DECANTER SPIKE VIAL GLASS SM (MISCELLANEOUS) ×3 IMPLANT
DRAIN CHANNEL 19F RND (DRAIN) IMPLANT
DRAPE LAPAROSCOPIC ABDOMINAL (DRAPES) ×3 IMPLANT
ELECT REM PT RETURN 9FT ADLT (ELECTROSURGICAL) ×3
ELECTRODE REM PT RTRN 9FT ADLT (ELECTROSURGICAL) ×1 IMPLANT
ENDOLOOP SUT PDS II  0 18 (SUTURE)
ENDOLOOP SUT PDS II 0 18 (SUTURE) IMPLANT
EVACUATOR SILICONE 100CC (DRAIN) IMPLANT
GLOVE BIOGEL PI IND STRL 7.0 (GLOVE) ×1 IMPLANT
GLOVE BIOGEL PI INDICATOR 7.0 (GLOVE) ×2
GLOVE SURG SS PI 7.0 STRL IVOR (GLOVE) ×3 IMPLANT
GOWN STRL REUS W/TWL LRG LVL3 (GOWN DISPOSABLE) ×3 IMPLANT
GOWN STRL REUS W/TWL XL LVL3 (GOWN DISPOSABLE) IMPLANT
GRASPER SUT TROCAR 14GX15 (MISCELLANEOUS) IMPLANT
IRRIG SUCT STRYKERFLOW 2 WTIP (MISCELLANEOUS) ×3
IRRIGATION SUCT STRKRFLW 2 WTP (MISCELLANEOUS) ×1 IMPLANT
KIT BASIN OR (CUSTOM PROCEDURE TRAY) ×3 IMPLANT
LIQUID BAND (GAUZE/BANDAGES/DRESSINGS) ×3 IMPLANT
POUCH RETRIEVAL ECOSAC 10 (ENDOMECHANICALS) IMPLANT
POUCH RETRIEVAL ECOSAC 10MM (ENDOMECHANICALS)
RELOAD 45 VASCULAR/THIN (ENDOMECHANICALS) IMPLANT
RELOAD STAPLE 45 2.5 WHT GRN (ENDOMECHANICALS) IMPLANT
RELOAD STAPLE 45 3.5 BLU ETS (ENDOMECHANICALS) IMPLANT
RELOAD STAPLE TA45 3.5 REG BLU (ENDOMECHANICALS) IMPLANT
SCISSORS LAP 5X35 DISP (ENDOMECHANICALS) ×3 IMPLANT
SHEARS HARMONIC ACE PLUS 36CM (ENDOMECHANICALS) ×2 IMPLANT
SLEEVE XCEL OPT CAN 5 100 (ENDOMECHANICALS) ×3 IMPLANT
SUT ETHILON 2 0 PS N (SUTURE) IMPLANT
SUT MNCRL AB 4-0 PS2 18 (SUTURE) ×3 IMPLANT
TOWEL OR 17X26 10 PK STRL BLUE (TOWEL DISPOSABLE) ×3 IMPLANT
TOWEL OR NON WOVEN STRL DISP B (DISPOSABLE) ×3 IMPLANT
TRAY LAPAROSCOPIC (CUSTOM PROCEDURE TRAY) ×3 IMPLANT
TROCAR BLADELESS OPT 5 100 (ENDOMECHANICALS) ×3 IMPLANT
TROCAR XCEL 12X100 BLDLESS (ENDOMECHANICALS) ×2 IMPLANT
TROCAR XCEL BLUNT TIP 100MML (ENDOMECHANICALS) IMPLANT
TUBING INSUF HEATED (TUBING) ×3 IMPLANT

## 2016-04-19 NOTE — Anesthesia Postprocedure Evaluation (Signed)
Anesthesia Post Note  Patient: Gabriel [name]  Procedure(s) Performed: Procedure(s) (LRB): APPENDECTOMY LAPAROSCOPIC (N/A)  Patient location during evaluation: PACU Anesthesia Type: General Level of consciousness: awake and alert Pain management: pain level controlled Vital Signs Assessment: post-procedure vital signs reviewed and stable Respiratory status: spontaneous breathing, nonlabored ventilation, respiratory function stable and patient connected to nasal cannula oxygen Cardiovascular status: blood pressure returned to baseline and stable Postop Assessment: no signs of nausea or vomiting Anesthetic complications: no    Last Vitals:  Vitals:   04/19/16 1245 04/19/16 1300  BP: 121/72 120/86  Pulse: 88 87  Resp: 17 16  Temp:  36.5 C    Last Pain:  Vitals:   04/19/16 1230  TempSrc:   PainSc: 0-No pain                 Rewa Weissberg J

## 2016-04-19 NOTE — Anesthesia Procedure Notes (Signed)
Procedure Name: Intubation Date/Time: 04/19/2016 10:37 AM Performed by: Jarvis NewcomerARMISTEAD, Eldon Zietlow A Pre-anesthesia Checklist: Patient identified, Emergency Drugs available, Suction available and Patient being monitored Patient Re-evaluated:Patient Re-evaluated prior to inductionOxygen Delivery Method: Circle system utilized Preoxygenation: Pre-oxygenation with 100% oxygen Intubation Type: IV induction Ventilation: Mask ventilation without difficulty Laryngoscope Size: Mac and 4 Grade View: Grade I Tube type: Oral Tube size: 7.5 mm Number of attempts: 1 Airway Equipment and Method: Stylet and Oral airway Placement Confirmation: ETT inserted through vocal cords under direct vision,  positive ETCO2 and breath sounds checked- equal and bilateral Secured at: 23 cm Tube secured with: Tape Dental Injury: Teeth and Oropharynx as per pre-operative assessment

## 2016-04-19 NOTE — Discharge Instructions (Signed)
Laparoscopic Appendectomy, Adult, Care After Refer to this sheet in the next few weeks. These instructions provide you with information on caring for yourself after your procedure. Your caregiver may also give you more specific instructions. Your treatment has been planned according to current medical practices, but problems sometimes occur. Call your caregiver if you have any problems or questions after your procedure. HOME CARE INSTRUCTIONS  Do not drive while taking narcotic pain medicines.  Use stool softener if you become constipated from your pain medicines.  Change your bandages (dressings) as directed.  Keep your wounds clean and dry. You may wash the wounds gently with soap and water. Gently pat the wounds dry with a clean towel.  Do not take baths, swim, or use hot tubs for 10 days, or as instructed by your caregiver.  Only take over-the-counter or prescription medicines for pain, discomfort, or fever as directed by your caregiver.  You may continue your normal diet as directed.  Do not lift more than 10 pounds (4.5 kg) or play contact sports for 3 weeks, or as directed.  Slowly increase your activity after surgery.  Take deep breaths to avoid getting a lung infection (pneumonia). SEEK MEDICAL CARE IF:  You have redness, swelling, or increasing pain in your wounds.  You have pus coming from your wounds.  You have drainage from a wound that lasts longer than 1 day.  You notice a bad smell coming from the wounds or dressing.  Your wound edges break open after stitches (sutures) have been removed.  You notice increasing pain in the shoulders (shoulder strap areas) or near your shoulder blades.  You develop dizzy episodes or fainting while standing.  You develop shortness of breath.  You develop persistent nausea or vomiting.  You cannot control your bowel functions or lose your appetite.  You develop diarrhea. SEEK IMMEDIATE MEDICAL CARE IF:   You have a  fever.  You develop a rash.  You have difficulty breathing or sharp pains in your chest.  You develop any reaction or side effects to medicines given. MAKE SURE YOU:  Understand these instructions.  Will watch your condition.  Will get help right away if you are not doing well or get worse.   This information is not intended to replace advice given to you by your health care provider. Make sure you discuss any questions you have with your health care provider.   Document Released: 07/11/2005 Document Revised: 11/25/2014 Document Reviewed: 12/29/2014 Elsevier Interactive Patient Education 2016 [employer] PATIENT INSTRUCTIONS POST-ANESTHESIA  IMMEDIATELY FOLLOWING SURGERY:  Do not drive or operate machinery for the first twenty four hours after surgery.  Do not make any important decisions for twenty four hours after surgery or while taking narcotic pain medications or sedatives.  If you develop intractable nausea and vomiting or a severe headache please notify your doctor immediately.  FOLLOW-UP:  Please make an appointment with your surgeon as instructed. You do not need to follow up with anesthesia unless specifically instructed to do so.  WOUND CARE INSTRUCTIONS (if applicable):  Keep a dry clean dressing on the anesthesia/puncture wound site if there is drainage.  Once the wound has quit draining you may leave it open to air.  Generally you should leave the bandage intact for twenty four hours unless there is drainage.  If the epidural site drains for more than 36-48 hours please call the anesthesia department.  QUESTIONS?:  Please feel free to call your physician or the hospital operator if  you have any questions, and they will be happy to assist you.

## 2016-04-19 NOTE — Op Note (Signed)
Preoperative diagnosis: perforated appendicitis  Postoperative diagnosis: Same   Procedure: interval laparoscopic appendectomy  Surgeon: Feliciana RossettiLuke Kinsinger, M.D.  Asst: none  Anesthesia: Gen.   Indications for procedure: Gabriel BariDarren [name] is a 45 y.o. male with symptoms of pain in right lower quadrant and nausea consistent with acute appendicitis. Confirmed by CT scan and laboratory values. He initially underwent drain placement of abscess. He has now recovered, he has occasional discomfort similar to initial symptoms and now presents for interval appendectomy  Description of procedure: The patient was brought into the operative suite, placed supine. Anesthesia was administered with endotracheal tube. The patient's left arm was tucked. All pressure points were offloaded by foam padding. The patient was prepped and draped in the usual sterile fashion.  A transverse incision was made to the left of the umbilicus and a 5mm optical entry trocar was used to gain access to that abdominal cavity.  Pneumoperitoneum was applied with high flow low pressure.  The periumbilical incision was upsized to a 12mm trocar. 1 5mm trocar were placed in the suprapubic space and 1 5mm trocar in the LLQ.Marland Kitchen. Pneumoperitoneum was applied with high flow low pressure.  2 5mm trocars were placed, one in the suprapubic space, one in the LLQ. All trocars sites were first anesthesized with 0.5% marcaine with epinephrine. Next the patient was placed in trendelenberg, rotated to the left. The omentum was retracted cephalad. The cecum and appendix were identified. The appendix was scarred into the retroperitoneum. Blunt dissection allowed a plane to be created and the appendix mobilized. The base of the appendix was dissected and a window through the mesoappendix was created with blunt dissection. Harmonic scalpel was used to dissect the mesoappendix away and 2 0 PDS endoloops were placed at the base of the appendix. The appendix was then cut  with scissors.  The appendix was placed in a specimen bag. The pelvis and RLQ were irrigated. The appendix was removed via the umbilicus. 0 vicryl was used to close the fascial defect. Pneumoperitoneum was removed, all trocars were removed. All incisions were closed with 4-0 monocryl subcuticular stitch. The patient woke from anesthesia and was brought to PACU in stable condition.  Findings: appendix scarred to the retroperitoneum.  Specimen: appendix  Blood loss: <30 ml  Local anesthesia: 40 ml 0.5% marcaine with epinephrine  Complications: none  Feliciana RossettiLuke Kinsinger, M.D. General, Bariatric, & Minimally Invasive Surgery [facility]

## 2016-04-19 NOTE — Transfer of Care (Signed)
Immediate Anesthesia Transfer of Care Note  Patient: Gabriel [name]  Procedure(s) Performed: Procedure(s): APPENDECTOMY LAPAROSCOPIC (N/A)  Patient Location: PACU  Anesthesia Type:General  Level of Consciousness: Patient easily awoken, sedated, comfortable, cooperative, following commands, responds to stimulation.   Airway & Oxygen Therapy: Patient spontaneously breathing, ventilating well, oxygen via simple oxygen mask.  Post-op Assessment: Report given to PACU RN, vital signs reviewed and stable, moving all extremities.   Post vital signs: Reviewed and stable.  Complications: No apparent anesthesia complications Last Vitals:  Vitals:   04/19/16 0753  BP: (!) 142/95  Pulse: 65  Resp: 18  Temp: 36.7 C    Last Pain:  Vitals:   04/19/16 0753  TempSrc: Oral         Complications: No apparent anesthesia complications

## 2016-04-19 NOTE — Anesthesia Preprocedure Evaluation (Signed)
Anesthesia Evaluation  Patient identified by MRN, date of birth, ID band Patient awake    Reviewed: Allergy & Precautions, NPO status , Patient's Chart, lab work & pertinent test results  Airway Mallampati: II  TM Distance: >3 FB Neck ROM: Full    Dental no notable dental hx.    Pulmonary neg pulmonary ROS,    Pulmonary exam normal breath sounds clear to auscultation       Cardiovascular negative cardio ROS Normal cardiovascular exam Rhythm:Regular Rate:Normal     Neuro/Psych negative neurological ROS  negative psych ROS   GI/Hepatic negative GI ROS, Neg liver ROS,   Endo/Other  negative endocrine ROS  Renal/GU negative Renal ROS  negative genitourinary   Musculoskeletal negative musculoskeletal ROS (+)   Abdominal (+) + obese,   Peds negative pediatric ROS (+)  Hematology negative hematology ROS (+)   Anesthesia Other Findings   Reproductive/Obstetrics negative OB ROS                             Anesthesia Physical Anesthesia Plan  ASA: II  Anesthesia Plan: General   Post-op Pain Management:    Induction: Intravenous  Airway Management Planned: Oral ETT  Additional Equipment:   Intra-op Plan:   Post-operative Plan: Extubation in OR  Informed Consent: I have reviewed the patients History and Physical, chart, labs and discussed the procedure including the risks, benefits and alternatives for the proposed anesthesia with the patient or authorized representative who has indicated his/her understanding and acceptance.   Dental advisory given  Plan Discussed with: CRNA  Anesthesia Plan Comments:         Anesthesia Quick Evaluation  
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
## 2016-04-26 ENCOUNTER — Encounter: Payer: Self-pay | Admitting: General Surgery
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
## 2020-03-12 ENCOUNTER — Other Ambulatory Visit: Payer: No Typology Code available for payment source

## 2020-03-12 ENCOUNTER — Other Ambulatory Visit: Payer: Self-pay | Admitting: Cardiology

## 2020-03-12 DIAGNOSIS — Z20822 Contact with and (suspected) exposure to covid-19: Secondary | ICD-10-CM

## 2020-03-13 LAB — SARS-COV-2, NAA 2 DAY TAT

## 2020-03-13 LAB — NOVEL CORONAVIRUS, NAA: SARS-CoV-2, NAA: NOT DETECTED

## 2020-04-11 ENCOUNTER — Other Ambulatory Visit: Payer: Self-pay

## 2020-04-11 ENCOUNTER — Emergency Department (HOSPITAL_COMMUNITY)
Admission: EM | Admit: 2020-04-11 | Discharge: 2020-04-11 | Disposition: A | Discharge status: Home / Self Care | Payer: No Typology Code available for payment source | Attending: Emergency Medicine | Admitting: Emergency Medicine

## 2020-04-11 ENCOUNTER — Encounter (HOSPITAL_COMMUNITY): Payer: Self-pay | Admitting: Emergency Medicine

## 2020-04-11 DIAGNOSIS — S161XXA Strain of muscle, fascia and tendon at neck level, initial encounter: Secondary | ICD-10-CM | POA: Insufficient documentation

## 2020-04-11 NOTE — Discharge Instructions (Addendum)
Use ice, heat packs, Tylenol and ibuprofen as needed for pain Return for new concerns including weakness, numbness.
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
## 2022-04-19 ENCOUNTER — Other Ambulatory Visit (HOSPITAL_COMMUNITY): Payer: Self-pay | Admitting: Family Medicine

## 2022-04-19 DIAGNOSIS — Z1331 Encounter for screening for depression: Secondary | ICD-10-CM | POA: Diagnosis not present

## 2022-04-19 DIAGNOSIS — Z6834 Body mass index (BMI) 34.0-34.9, adult: Secondary | ICD-10-CM | POA: Diagnosis not present

## 2022-04-19 DIAGNOSIS — Z0001 Encounter for general adult medical examination with abnormal findings: Secondary | ICD-10-CM | POA: Diagnosis not present

## 2022-04-19 DIAGNOSIS — Z113 Encounter for screening for infections with a predominantly sexual mode of transmission: Secondary | ICD-10-CM | POA: Diagnosis not present

## 2022-04-19 DIAGNOSIS — M25552 Pain in left hip: Secondary | ICD-10-CM

## 2022-10-09 DIAGNOSIS — H5789 Other specified disorders of eye and adnexa: Secondary | ICD-10-CM | POA: Diagnosis not present

## 2022-10-09 DIAGNOSIS — I10 Essential (primary) hypertension: Secondary | ICD-10-CM | POA: Diagnosis not present

## 2022-10-13 DIAGNOSIS — E6609 Other obesity due to excess calories: Secondary | ICD-10-CM | POA: Diagnosis not present

## 2022-10-13 DIAGNOSIS — J302 Other seasonal allergic rhinitis: Secondary | ICD-10-CM | POA: Diagnosis not present

## 2022-10-13 DIAGNOSIS — Z6834 Body mass index (BMI) 34.0-34.9, adult: Secondary | ICD-10-CM | POA: Diagnosis not present

## 2022-10-13 DIAGNOSIS — H02841 Edema of right upper eyelid: Secondary | ICD-10-CM | POA: Diagnosis not present

## 2023-01-10 ENCOUNTER — Emergency Department (HOSPITAL_COMMUNITY)
Admission: EM | Admit: 2023-01-10 | Discharge: 2023-01-11 | Disposition: A | Discharge status: Home / Self Care | Payer: BC Managed Care – PPO | Attending: Emergency Medicine | Admitting: Emergency Medicine

## 2023-01-10 ENCOUNTER — Other Ambulatory Visit: Payer: Self-pay

## 2023-01-10 ENCOUNTER — Encounter (HOSPITAL_COMMUNITY): Payer: Self-pay

## 2023-01-10 DIAGNOSIS — K625 Hemorrhage of anus and rectum: Secondary | ICD-10-CM | POA: Insufficient documentation

## 2023-01-10 DIAGNOSIS — Z79899 Other long term (current) drug therapy: Secondary | ICD-10-CM | POA: Insufficient documentation

## 2023-01-10 DIAGNOSIS — I10 Essential (primary) hypertension: Secondary | ICD-10-CM | POA: Diagnosis not present

## 2023-01-10 LAB — COMPREHENSIVE METABOLIC PANEL
ALT: 23 U/L (ref 0–44)
AST: 24 U/L (ref 15–41)
Albumin: 4 g/dL (ref 3.5–5.0)
Alkaline Phosphatase: 53 U/L (ref 38–126)
Anion gap: 8 (ref 5–15)
BUN: 18 mg/dL (ref 6–20)
CO2: 23 mmol/L (ref 22–32)
Calcium: 8.7 mg/dL — ABNORMAL LOW (ref 8.9–10.3)
Chloride: 106 mmol/L (ref 98–111)
Creatinine, Ser: 0.99 mg/dL (ref 0.61–1.24)
GFR, Estimated: >60 mL/min (ref >60)
Glucose, Bld: 109 mg/dL — ABNORMAL HIGH (ref 70–99)
Potassium: 3.6 mmol/L (ref 3.5–5.1)
Sodium: 137 mmol/L (ref 135–145)
Total Bilirubin: 0.4 mg/dL (ref 0.3–1.2)
Total Protein: 7.2 g/dL (ref 6.5–8.1)

## 2023-01-10 LAB — TYPE AND SCREEN: ABO/RH(D): O POS

## 2023-01-10 LAB — CBC
HCT: 40 % (ref 39.0–52.0)
Hemoglobin: 13.8 g/dL (ref 13.0–17.0)
MCH: 32.9 pg (ref 26.0–34.0)
MCHC: 34.5 g/dL (ref 30.0–36.0)
MCV: 95.2 fL (ref 80.0–100.0)
Platelets: 248 10*3/uL (ref 150–400)
RBC: 4.2 MIL/uL — ABNORMAL LOW (ref 4.22–5.81)
RDW: 13 % (ref 11.5–15.5)
WBC: 4.9 10*3/uL (ref 4.0–10.5)
nRBC: 0 % (ref 0.0–0.2)

## 2023-01-10 NOTE — ED Triage Notes (Signed)
Pt c/o rectal bleed x2 times today while going to the bathroom. Pt states that it was light pink. Denies abdominal pain.

## 2023-01-11 NOTE — Discharge Instructions (Addendum)
Begin taking Metamucil, 1 to heaping teaspoon in a glass of water 3 times daily.  Begin taking Colace (equate stool softener) 100 mg twice daily.  Follow-up in the next few days with gastroenterology.  The contact information for Dr. Levon Hedger has been provided in this discharge summary for you to call and make these arrangements.  Return to the emergency department if you develop worsening bleeding, dizziness/lightheadedness, severe abdominal pain, or for other new and concerning symptoms.

## 2023-01-11 NOTE — ED Notes (Signed)
Dr. Judd Lien at bedside to assess pt, hemoccult card given to provider for possible POC occult blood testing

## 2023-01-11 NOTE — ED Provider Notes (Signed)
[place] EMERGENCY DEPARTMENT AT [facility] Provider Note   CSN: [id number] Arrival date & time: 01/10/23  2237     History  Chief Complaint  Patient presents with   Rectal Bleeding    Kerron Buetow is a 52 y.o. male.  Patient is a 52 year old male with history of hypertension presenting with complaints of rectal bleeding.  He reports going to the bathroom earlier this evening and passing bright red blood.  1 week ago, he reports an episode of constipation for which he strained and passed hard stool.  When he wiped after that bowel movement, he also noticed a little bit of blood on the toilet paper.  He denies he is having any discomfort.  He has never had a colonoscopy.  No aggravating or alleviating factors.  The history is provided by the patient.       Home Medications Prior to Admission medications   Medication Sig Start Date End Date Taking? Authorizing Provider  amLODipine (NORVASC) 5 MG tablet Take 1 tablet (5 mg total) by mouth daily. Patient not taking: Reported on 04/14/2016 11/12/15   Ward, Layla Maw, DO  HYDROcodone-acetaminophen (NORCO/VICODIN) 5-325 MG tablet Take 1-2 tablets by mouth every 6 (six) hours as needed for moderate pain. 04/19/16   Kinsinger, De Blanch, MD  ibuprofen (ADVIL,MOTRIN) 800 MG tablet Take 1 tablet (800 mg total) by mouth every 8 (eight) hours as needed. 04/19/16   Kinsinger, De Blanch, MD  ondansetron (ZOFRAN) 4 MG tablet Take 1 tablet (4 mg total) by mouth every 6 (six) hours. Patient not taking: Reported on 04/14/2016 04/06/16   Donnetta Hutching, MD      Allergies    Patient has no known allergies.    Review of Systems   Review of Systems  All other systems reviewed and are negative.   Physical Exam Updated Vital Signs BP (!) 171/103   Pulse (!) 58   Temp 98.5 F (36.9 C) (Oral)   Resp 18   SpO2 95%  Physical Exam Vitals and nursing note reviewed.  Constitutional:      Appearance: Normal appearance.  Pulmonary:      Effort: Pulmonary effort is normal.  Abdominal:     General: There is no distension.     Tenderness: There is no abdominal tenderness.  Genitourinary:    Comments: Rectal examination is unremarkable.  There are no external hemorrhoids, no visible fissures, and no palpable abnormality on digital rectal exam. Skin:    General: Skin is warm and dry.  Neurological:     Mental Status: He is alert and oriented to person, place, and time.     ED Results / Procedures / Treatments   Labs (all labs ordered are listed, but only abnormal results are displayed) Labs Reviewed  COMPREHENSIVE METABOLIC PANEL - Abnormal; Notable for the following components:      Result Value   Glucose, Bld 109 (*)    Calcium 8.7 (*)    All other components within normal limits  CBC - Abnormal; Notable for the following components:   RBC 4.20 (*)    All other components within normal limits  POC OCCULT BLOOD, ED  TYPE AND SCREEN    EKG None  Radiology No results found.  Procedures Procedures    Medications Ordered in ED Medications - No data to display  ED Course/ Medical Decision Making/ A&P  Patient is a 52 year old male presenting with rectal bleeding as described in the HPI.  He arrives here with  stable vital signs and is afebrile.  His abdomen is benign.  Rectal examination unremarkable.  Laboratory studies obtained in triage reveal a hemoglobin of 13.8.  There is no leukocytosis or electrolyte derangement.  Patient's bleeding likely from a fissure/tear given the reported history.  Also considered is an internal hemorrhoid, bleeding diverticulum, or possibly neoplasm.  Patient not actively bleeding here in the ER and I feel can safely be discharged.  I will refer him to gastroenterology and is to return if symptoms worsen or change.  I will also recommend stool softeners in case he has a fissure.  Final Clinical Impression(s) / ED Diagnoses Final diagnoses:  None    Rx / DC Orders ED  Discharge Orders     None         Geoffery Lyons, MD 01/11/23 [id number]
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
## 2023-01-12 ENCOUNTER — Ambulatory Visit: Payer: No Typology Code available for payment source | Admitting: Gastroenterology

## 2023-01-17 ENCOUNTER — Encounter: Payer: Self-pay | Admitting: Gastroenterology

## 2023-12-28 DIAGNOSIS — E6609 Other obesity due to excess calories: Secondary | ICD-10-CM | POA: Diagnosis not present

## 2023-12-28 DIAGNOSIS — I10 Essential (primary) hypertension: Secondary | ICD-10-CM | POA: Diagnosis not present

## 2023-12-28 DIAGNOSIS — J302 Other seasonal allergic rhinitis: Secondary | ICD-10-CM | POA: Diagnosis not present

## 2023-12-28 DIAGNOSIS — Z6833 Body mass index (BMI) 33.0-33.9, adult: Secondary | ICD-10-CM | POA: Diagnosis not present

## 2023-12-28 DIAGNOSIS — T783XXA Angioneurotic edema, initial encounter: Secondary | ICD-10-CM | POA: Diagnosis not present

## 2023-12-28 DIAGNOSIS — E782 Mixed hyperlipidemia: Secondary | ICD-10-CM | POA: Diagnosis not present

## 2024-08-21 ENCOUNTER — Encounter (HOSPITAL_COMMUNITY): Payer: Self-pay

## 2024-08-21 ENCOUNTER — Other Ambulatory Visit: Payer: Self-pay

## 2024-08-21 ENCOUNTER — Emergency Department (HOSPITAL_COMMUNITY)

## 2024-08-21 ENCOUNTER — Emergency Department (HOSPITAL_COMMUNITY)
Admission: EM | Admit: 2024-08-21 | Discharge: 2024-08-21 | Disposition: A | Discharge status: Home / Self Care | Attending: Emergency Medicine | Admitting: Emergency Medicine

## 2024-08-21 DIAGNOSIS — Y93K1 Activity, walking an animal: Secondary | ICD-10-CM | POA: Diagnosis not present

## 2024-08-21 DIAGNOSIS — S299XXA Unspecified injury of thorax, initial encounter: Secondary | ICD-10-CM | POA: Diagnosis present

## 2024-08-21 DIAGNOSIS — I10 Essential (primary) hypertension: Secondary | ICD-10-CM | POA: Diagnosis not present

## 2024-08-21 DIAGNOSIS — S20211A Contusion of right front wall of thorax, initial encounter: Secondary | ICD-10-CM | POA: Insufficient documentation

## 2024-08-21 DIAGNOSIS — S298XXA Other specified injuries of thorax, initial encounter: Secondary | ICD-10-CM

## 2024-08-21 DIAGNOSIS — W010XXA Fall on same level from slipping, tripping and stumbling without subsequent striking against object, initial encounter: Secondary | ICD-10-CM | POA: Diagnosis not present

## 2024-08-21 DIAGNOSIS — Z79899 Other long term (current) drug therapy: Secondary | ICD-10-CM | POA: Diagnosis not present

## 2024-08-21 MED ORDER — HYDROCODONE-ACETAMINOPHEN 5-325 MG PO TABS: ORAL_TABLET | ORAL | 0 refills | Status: AC

## 2024-08-21 NOTE — Discharge Instructions (Signed)
 Use a small pillow or a folded towel held firmly to your right side to cough and take deep breaths several times a day.  We discussed straining or heavy lifting for at least 1 to 2 weeks.  Your blood pressure today was elevated.  Please take your blood pressure medication when you return home and to follow-up with your primary care provider next week to have your blood pressure rechecked.  Avoid drinking alcohol while taking narcotic pain medication.

## 2024-08-21 NOTE — ED Provider Notes (Signed)
 " [place] EMERGENCY DEPARTMENT AT [facility] Provider Note   CSN: [id number] Arrival date & time: 08/21/24  1330     Patient presents with: Gabriel [name] is a 54 y.o. male.    Fall Associated symptoms include chest pain (Right rib pain).       Gabriel [name] is a 54 y.o. male with past medical history of hypertension who presents to the Emergency Department complaining of right rib pain after mechanical fall that occurred over the weekend.  States that he was drinking alcohol at the time and was walking his dog when he slipped and fell landing on his right side.  He has had right lateral chest pain that worsens with movement and deep breathing since his fall.  He denies any head injury low back pain neck pain or LOC.  He denies any abdominal pain nausea or vomiting.  He does not have any shortness of breath.  Prior to Admission medications  Medication Sig Start Date End Date Taking? Authorizing Provider  amLODipine  (NORVASC ) 5 MG tablet Take 1 tablet (5 mg total) by mouth daily. Patient not taking: Reported on 04/14/2016 11/12/15   Ward, Josette SAILOR, DO  HYDROcodone -acetaminophen  (NORCO/VICODIN) 5-325 MG tablet Take 1-2 tablets by mouth every 6 (six) hours as needed for moderate pain. 04/19/16   Kinsinger, Herlene Righter, MD  ibuprofen  (ADVIL ,MOTRIN ) 800 MG tablet Take 1 tablet (800 mg total) by mouth every 8 (eight) hours as needed. 04/19/16   Kinsinger, Herlene Righter, MD  ondansetron  (ZOFRAN ) 4 MG tablet Take 1 tablet (4 mg total) by mouth every 6 (six) hours. Patient not taking: Reported on 04/14/2016 04/06/16   Bluford Rogue, MD    Allergies: Patient has no known allergies.    Review of Systems  Cardiovascular:  Positive for chest pain (Right rib pain).  All other systems reviewed and are negative.   Updated Vital Signs BP (!) 176/113 (BP Location: Left Arm)   Pulse 80   Temp 97.9 F (36.6 C)   Resp 20   Wt 122.5 kg   SpO2 98%   BMI 34.20 kg/m   Physical  Exam Vitals and nursing note reviewed.  Constitutional:      General: He is not in acute distress.    Appearance: Normal appearance. He is not toxic-appearing.  HENT:     Head: Atraumatic.  Cardiovascular:     Rate and Rhythm: Normal rate and regular rhythm.     Pulses: Normal pulses.  Pulmonary:     Effort: Pulmonary effort is normal.  Chest:     Chest wall: Tenderness (Tender to palpation lateral mid right ribs.  No bony or soft tissue crepitus on exam.  No guarding on exam) present.  Abdominal:     General: There is no distension.     Palpations: Abdomen is soft.     Tenderness: There is no abdominal tenderness. There is no right CVA tenderness or left CVA tenderness.  Musculoskeletal:        General: Normal range of motion.     Cervical back: Normal range of motion.  Skin:    General: Skin is warm.     Capillary Refill: Capillary refill takes less than 2 seconds.  Neurological:     General: No focal deficit present.     Mental Status: He is alert.     Sensory: No sensory deficit.     Motor: No weakness.     (all labs ordered are listed, but only  abnormal results are displayed) Labs Reviewed - No data to display  EKG: None  Radiology: DG Ribs Unilateral W/Chest Right Result Date: 08/21/2024 EXAM: 1 AP VIEW(S) XRAY OF THE RIGHT RIBS AND CHEST 08/21/2024 02:27:00 PM COMPARISON: Comparison 10/10/2015. CLINICAL HISTORY: Injury. FINDINGS: BONES: No acute displaced rib fracture. LUNGS AND PLEURA: Elevated right hemidiaphragm. Bowel interposition under right hemidiaphragm. Improved aeration. Probable small right pleural effusion. No consolidation or pulmonary edema. No pneumothorax. HEART AND MEDIASTINUM: No acute abnormality of the cardiac and mediastinal silhouettes. IMPRESSION: 1. No acute rib fracture. 2. Probable small right pleural effusion. Electronically signed by: Dayne Hassell MD 08/21/2024 03:23 PM EST RP Workstation: [id number]     Procedures   Medications  Ordered in the ED - No data to display  Clinical Course as of 08/21/24 1725  Wed Aug 21, 2024  1713 DG Ribs Unilateral W/Chest Right [TT]    Clinical Course User Index [TT] Herlinda Milling, PA-C                                 Medical Decision Making  Patient here from home for evaluation of right rib pain after mechanical fall.  Clemens over the weekend landing on his right side.  He has had pain of his ribs since the fall.  Pain worsens with movement and improves at rest.  He denies any shortness of breath or abdominal pain.  No nausea vomiting head injury or LOC.  On my exam, patient well-appearing nontoxic.  He is very hypertensive but otherwise vitals are reassuring.  He does take antihypertensive medications, states he did not take his medication today.  He does have localized tenderness along the right lateral rib area.  I do not appreciate any bony step-offs or crepitus.  His abdomen is soft and nontender without guarding or rebound.  I suspect rib contusion, fracture may also be considered.  His lung sounds are clear to auscultation bilaterally pneumothorax also considered but felt less likely.  Amount and/or Complexity of Data Reviewed Radiology: ordered. Decision-making details documented in ED Course.    Details: X-ray of right ribs with chest shows no acute rib fracture Discussion of management or test interpretation with external provider(s):  Patient with likely rib contusion, occult fracture may also be present.  Will treat symptomatically.  He has his entire hypertensive medications, I have recommended that he take his medication when he returns home.  He has follow-up appointment with his PCP on Monday I feel he is appropriate for discharge home at this time we will treat his rib pain with small course of pain medication.  He was advised to avoid alcohol use while taking pain medication.  He was given return precautions        Final diagnoses:  Rib contusion, right,  initial encounter    ED Discharge Orders     None          Herlinda Milling, [place] 08/21/24 1738  "

## 2024-08-21 NOTE — ED Notes (Signed)
 Pt states he fell on Sunday, hurting right side ribs.  Pain has gotten progressively worse, particularly painful with movement.  6/10 pain

## 2024-08-21 NOTE — ED Triage Notes (Signed)
 Pt reports he was walking a dog while intoxicated on Sunday and he fell.  Pt reports right rib pain ever since.
